# Patient Record
Sex: Female | Born: 1995
Health system: Southern US, Community
[De-identification: ages and names within clinical notes are randomized; demographics above are authoritative.]

## PROBLEM LIST (undated history)

## (undated) DIAGNOSIS — E559 Vitamin D deficiency, unspecified: Secondary | ICD-10-CM

## (undated) DIAGNOSIS — R42 Dizziness and giddiness: Secondary | ICD-10-CM

## (undated) DIAGNOSIS — R002 Palpitations: Secondary | ICD-10-CM

## (undated) DIAGNOSIS — G44209 Tension-type headache, unspecified, not intractable: Secondary | ICD-10-CM

## (undated) DIAGNOSIS — G43009 Migraine without aura, not intractable, without status migrainosus: Secondary | ICD-10-CM

## (undated) DIAGNOSIS — F419 Anxiety disorder, unspecified: Secondary | ICD-10-CM

## (undated) HISTORY — DX: Dizziness and giddiness: R42

## (undated) HISTORY — PX: NO PAST SURGERIES: SHX2092

## (undated) HISTORY — DX: Vitamin D deficiency, unspecified: E55.9

## (undated) HISTORY — DX: Anxiety disorder, unspecified: F41.9

## (undated) HISTORY — DX: Tension-type headache, unspecified, not intractable: G44.209

## (undated) HISTORY — DX: Migraine without aura, not intractable, without status migrainosus: G43.009

## (undated) HISTORY — DX: Palpitations: R00.2

---
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
## 2015-02-10 ENCOUNTER — Ambulatory Visit: Payer: Medicaid Other | Admitting: Neurology

## 2015-04-09 ENCOUNTER — Encounter: Payer: Self-pay | Admitting: Neurology

## 2018-03-16 ENCOUNTER — Telehealth: Payer: Self-pay | Admitting: *Deleted

## 2018-03-16 NOTE — Telephone Encounter (Signed)
REFERRAL SENT TO SCHEDULING, NOTES ON FILE FROM [facility] [place] [place] [phone].

## 2018-03-27 ENCOUNTER — Telehealth: Payer: Self-pay

## 2018-03-27 NOTE — Telephone Encounter (Signed)
NOTES FAXED TO [facility] °

## 2018-03-27 NOTE — Telephone Encounter (Signed)
NOTES FAXED TO [facility] °

## 2018-03-28 DIAGNOSIS — R42 Dizziness and giddiness: Secondary | ICD-10-CM

## 2018-03-28 DIAGNOSIS — R002 Palpitations: Secondary | ICD-10-CM

## 2018-03-28 HISTORY — DX: Palpitations: R00.2

## 2018-03-28 HISTORY — DX: Dizziness and giddiness: R42

## 2018-03-30 ENCOUNTER — Encounter: Payer: Self-pay | Admitting: Cardiology

## 2018-03-30 ENCOUNTER — Ambulatory Visit (INDEPENDENT_AMBULATORY_CARE_PROVIDER_SITE_OTHER): Payer: BLUE CROSS/BLUE SHIELD | Admitting: Cardiology

## 2018-03-30 VITALS — BP 104/70 | HR 62 | Ht 66.0 in | Wt 188.0 lb

## 2018-03-30 DIAGNOSIS — R071 Chest pain on breathing: Secondary | ICD-10-CM

## 2018-03-30 DIAGNOSIS — R0789 Other chest pain: Secondary | ICD-10-CM

## 2018-03-30 DIAGNOSIS — R9431 Abnormal electrocardiogram [ECG] [EKG]: Secondary | ICD-10-CM

## 2018-03-30 MED ORDER — MELOXICAM 15 MG PO TABS: 15.0000 mg | ORAL_TABLET | Freq: Every day | ORAL | 3 refills | Status: DC

## 2018-03-30 NOTE — Patient Instructions (Addendum)
Medication Instructions:  Your physician has recommended you make the following change in your medication:   START meloxicam (mobic) 15 mg daily   Labwork: None  Testing/Procedures: You had an EKG today.   Your physician has requested that you have an echocardiogram. Echocardiography is a painless test that uses sound waves to create images of your heart. It provides your doctor with information about the size and shape of your heart and how well your heart's chambers and valves are working. This procedure takes approximately one hour. There are no restrictions for this procedure.  Follow-Up: Your physician recommends that you schedule a follow-up appointment in: 6 weeks.   If you need a refill on your cardiac medications before your next appointment, please call your pharmacy.   Thank you for choosing [facility]! Mady GemmaCatherine Lockhart, RN [phone]      Costochondritis Costochondritis is swelling and irritation (inflammation) of the tissue (cartilage) that connects your ribs to your breastbone (sternum). This causes pain in the front of your chest. Usually, the pain:  Starts gradually.  Is in more than one rib.  This condition usually goes away on its own over time. Follow these instructions at home:  Do not do anything that makes your pain worse.  If directed, put ice on the painful area: ? Put ice in a plastic bag. ? Place a towel between your skin and the bag. ? Leave the ice on for 20 minutes, 2-3 times a day.  If directed, put heat on the affected area as often as told by your doctor. Use the heat source that your doctor tells you to use, such as a moist heat pack or a heating pad. ? Place a towel between your skin and the heat source. ? Leave the heat on for 20-30 minutes. ? Take off the heat if your skin turns bright red. This is very important if you cannot feel pain, heat, or cold. You may have a greater risk of getting burned.  Take over-the-counter and  prescription medicines only as told by your doctor.  Return to your normal activities as told by your doctor. Ask your doctor what activities are safe for you.  Keep all follow-up visits as told by your doctor. This is important. Contact a doctor if:  You have chills or a fever.  Your pain does not go away or it gets worse.  You have a cough that does not go away. Get help right away if:  You are short of breath. This information is not intended to replace advice given to you by your health care provider. Make sure you discuss any questions you have with your health care provider. Document Released: 12/14/2007 Document Revised: 01/15/2016 Document Reviewed: 10/21/2015 Elsevier Interactive Patient Education  2018 [employer]   Echocardiogram An echocardiogram, or echocardiography, uses sound waves (ultrasound) to produce an image of your heart. The echocardiogram is simple, painless, obtained within a short period of time, and offers valuable information to your health care provider. The images from an echocardiogram can provide information such as:  Evidence of coronary artery disease (CAD).  Heart size.  Heart muscle function.  Heart valve function.  Aneurysm detection.  Evidence of a past heart attack.  Fluid buildup around the heart.  Heart muscle thickening.  Assess heart valve function.  Tell a health care provider about:  Any allergies you have.  All medicines you are taking, including vitamins, herbs, eye drops, creams, and over-the-counter medicines.  Any problems you or family  members have had with anesthetic medicines.  Any blood disorders you have.  Any surgeries you have had.  Any medical conditions you have.  Whether you are pregnant or may be pregnant. What happens before the procedure? No special preparation is needed. Eat and drink normally. What happens during the procedure?  In order to produce an image of your heart, gel will be  applied to your chest and a wand-like tool (transducer) will be moved over your chest. The gel will help transmit the sound waves from the transducer. The sound waves will harmlessly bounce off your heart to allow the heart images to be captured in real-time motion. These images will then be recorded.  You may need an IV to receive a medicine that improves the quality of the pictures. What happens after the procedure? You may return to your normal schedule including diet, activities, and medicines, unless your health care provider tells you otherwise. This information is not intended to replace advice given to you by your health care provider. Make sure you discuss any questions you have with your health care provider. Document Released: 06/24/2000 Document Revised: 02/13/2016 Document Reviewed: 03/04/2013 Elsevier Interactive Patient Education  2017 Elsevier Inc.   Meloxicam tablets What is this medicine? MELOXICAM (mel OX i cam) is a non-steroidal anti-inflammatory drug (NSAID). It is used to reduce swelling and to treat pain. It may be used for osteoarthritis, rheumatoid arthritis, or juvenile rheumatoid arthritis. This medicine may be used for other purposes; ask your health care provider or pharmacist if you have questions. COMMON BRAND NAME(S): Mobic What should I tell my health care provider before I take this medicine? They need to know if you have any of these conditions: -bleeding disorders -cigarette smoker -coronary artery bypass graft (CABG) surgery within the past 2 weeks -drink more than 3 alcohol-containing drinks per day -heart disease -high blood pressure -history of stomach bleeding -kidney disease -liver disease -lung or breathing disease, like asthma -stomach or intestine problems -an unusual or allergic reaction to meloxicam, aspirin, other NSAIDs, other medicines, foods, dyes, or preservatives -pregnant or trying to get pregnant -breast-feeding How should I use  this medicine? Take this medicine by mouth with a full glass of water. Follow the directions on the prescription label. You can take it with or without food. If it upsets your stomach, take it with food. Take your medicine at regular intervals. Do not take it more often than directed. Do not stop taking except on your doctor's advice. A special MedGuide will be given to you by the pharmacist with each prescription and refill. Be sure to read this information carefully each time. Talk to your pediatrician regarding the use of this medicine in children. While this drug may be prescribed for selected conditions, precautions do apply. Patients over 76 years old may have a stronger reaction and need a smaller dose. Overdosage: If you think you have taken too much of this medicine contact a poison control center or emergency room at once. NOTE: This medicine is only for you. Do not share this medicine with others. What if I miss a dose? If you miss a dose, take it as soon as you can. If it is almost time for your next dose, take only that dose. Do not take double or extra doses. What may interact with this medicine? Do not take this medicine with any of the following medications: -cidofovir -ketorolac This medicine may also interact with the following medications: -aspirin and aspirin-like medicines -certain medicines  for blood pressure, heart disease, irregular heart beat -certain medicines for depression, anxiety, or psychotic disturbances -certain medicines that treat or prevent blood clots like warfarin, enoxaparin, dalteparin, apixaban, dabigatran, rivaroxaban -cyclosporine -digoxin -diuretics -methotrexate -other NSAIDs, medicines for pain and inflammation, like ibuprofen and naproxen -pemetrexed This list may not describe all possible interactions. Give your health care provider a list of all the medicines, herbs, non-prescription drugs, or dietary supplements you use. Also tell them if you  smoke, drink alcohol, or use illegal drugs. Some items may interact with your medicine. What should I watch for while using this medicine? Tell your doctor or healthcare professional if your symptoms do not start to get better or if they get worse. Do not take other medicines that contain aspirin, ibuprofen, or naproxen with this medicine. Side effects such as stomach upset, nausea, or ulcers may be more likely to occur. Many medicines available without a prescription should not be taken with this medicine. This medicine can cause ulcers and bleeding in the stomach and intestines at any time during treatment. This can happen with no warning and may cause death. There is increased risk with taking this medicine for a long time. Smoking, drinking alcohol, older age, and poor health can also increase risks. Call your doctor right away if you have stomach pain or blood in your vomit or stool. This medicine does not prevent heart attack or stroke. In fact, this medicine may increase the chance of a heart attack or stroke. The chance may increase with longer use of this medicine and in people who have heart disease. If you take aspirin to prevent heart attack or stroke, talk with your doctor or health care professional. What side effects may I notice from receiving this medicine? Side effects that you should report to your doctor or health care professional as soon as possible: -allergic reactions like skin rash, itching or hives, swelling of the face, lips, or tongue -nausea, vomiting -signs and symptoms of a blood clot such as breathing problems; changes in vision; chest pain; severe, sudden headache; pain, swelling, warmth in the leg; trouble speaking; sudden numbness or weakness of the face, arm, or leg -signs and symptoms of bleeding such as bloody or black, tarry stools; red or dark-brown urine; spitting up blood or brown material that looks like coffee grounds; red spots on the skin; unusual bruising or  bleeding from the eye, gums, or nose -signs and symptoms of liver injury like dark yellow or brown urine; general ill feeling or flu-like symptoms; light-colored stools; loss of appetite; nausea; right upper belly pain; unusually weak or tired; yellowing of the eyes or skin -signs and symptoms of stroke like changes in vision; confusion; trouble speaking or understanding; severe headaches; sudden numbness or weakness of the face, arm, or leg; trouble walking; dizziness; loss of balance or coordination Side effects that usually do not require medical attention (report to your doctor or health care professional if they continue or are bothersome): -constipation -diarrhea -gas This list may not describe all possible side effects. Call your doctor for medical advice about side effects. You may report side effects to FDA at 1-800-FDA-1088. Where should I keep my medicine? Keep out of the reach of children. Store at room temperature between 15 and 30 degrees C (59 and 86 degrees F). Throw away any unused medicine after the expiration date. NOTE: This sheet is a summary. It may not cover all possible information. If you have questions about this medicine, talk to your  doctor, pharmacist, or health care provider.  2018 Elsevier/Gold Standard (2015-07-29 19:28:16)

## 2018-03-30 NOTE — Progress Notes (Signed)
Cardiology Office Note:    Date:  03/30/2018   ID:  Alejandra Bush, DOB 1996-02-20, MRN [id number]  PCP:  Toniann FailScholer, Andrea M, MD  Cardiologist:  Norman HerrlichBrian Munley, MD   Referring MD: Assunta FoundSullivan, Emily B, [place]  ASSESSMENT:    1. Costochondral chest pain   2. Abnormal EKG    PLAN:    In order of problems listed above:  1. Clinically she appears to have very typical costochondral chest pain for further reassurance with her dynamic EKG changes I asked her to undergo echocardiogram and I placed her on a nonsteroidal anti-inflammatory drug as untreated this can become a chronic pain syndrome 2. See above EKG changes are likely nonspecific but in setting of chest pain further evaluation echocardiogram  Next appointment 6 weeks   Medication Adjustments/Labs and Tests Ordered: Current medicines are reviewed at length with the patient today.  Concerns regarding medicines are outlined above.  Orders Placed This Encounter  Procedures  . EKG 12-Lead  . ECHOCARDIOGRAM COMPLETE   Meds ordered this encounter  Medications  . meloxicam (MOBIC) 15 MG tablet    Sig: Take 1 tablet (15 mg total) by mouth daily.    Dispense:  30 tablet    Refill:  3     Chief Complaint  Patient presents with  . Palpitations  . Abnormal ECG    History of Present Illness:    Alejandra Bush is a 22 y.o. female who is being seen today for the evaluation of palpitation and an abnormal EKG at the request of Assunta FoundSullivan, Emily B, [place]. An EKG 8 [facility] 03/05/2018 shows sinus rhythm and precordial T wave inversions leads V1 to V4 it does not have other characteristics of hypertrophic cardiomyopathy this may be a pattern of persistent juvenile T waves.  She was referred with palpitation but when you speak to the patient she is quite firm that she is not aware of her heart beating irregularly but what she feels is a sharp discomfort localized to the left costochondral junction that comes and goes.  She has had no chest  wall trauma and has not had any overuse activities.  She has had no palpitations syncope no anginal discomfort or shortness of breath.  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  Past Medical History:  Diagnosis Date  . Dizziness 03/28/2018  . Migraine without aura and without status migrainosus, not intractable 11/18/2014  . Palpitations 03/28/2018  . Tension headache 11/18/2014  . Vitamin D deficiency 11/18/2014    Past Surgical History:  Procedure Laterality Date  . NO PAST SURGERIES      Current Medications: No outpatient medications have been marked as taking for the 03/30/18 encounter (Office Visit) with Baldo DaubMunley, Brian J, MD.     Allergies:   Patient has no known allergies.   Social History   Socioeconomic History  . Marital status: Single    Spouse name: Not on file  . Number of children: Not on file  . Years of education: Not on file  . Highest education level: Not on file  Occupational History  . Not on file  Social Needs  . Financial resource strain: Not on file  . Food insecurity:    Worry: Not on file    Inability: Not on file  . Transportation needs:    Medical: Not on file    Non-medical: Not on file  Tobacco Use  . Smoking status:  Never Smoker  . Smokeless tobacco: Never Used  Substance and Sexual Activity  . Alcohol use: No    Alcohol/week: 0.0 standard drinks  . Drug use: No  . Sexual activity: Never    Birth control/protection: Abstinence  Lifestyle  . Physical activity:    Days per week: Not on file    Minutes per session: Not on file  . Stress: Not on file  Relationships  . Social connections:    Talks on phone: Not on file    Gets together: Not on file    Attends religious service: Not on file    Active member of club or organization: Not on file    Attends meetings of clubs or organizations: Not on file    Relationship status: Not on file  Other Topics Concern  .  Not on file  Social History Narrative  . Not on file     Family History: The patient's family history is negative for Heart attack, Sudden death, Diabetes, and Cancer.  ROS:   Review of Systems  Constitution: Positive for malaise/fatigue.  HENT: Negative.   Eyes: Negative.   Cardiovascular: Positive for chest pain.  Respiratory: Negative.   Endocrine: Negative.   Hematologic/Lymphatic: Negative.   Skin: Negative.   Musculoskeletal: Negative.   Gastrointestinal: Positive for bloating.  Genitourinary: Negative.   Neurological: Positive for dizziness and headaches.  Psychiatric/Behavioral: Negative.   Allergic/Immunologic: Negative.    Please see the history of present illness.     All other systems reviewed and are negative.  EKGs/Labs/Other Studies Reviewed:    The following studies were reviewed today:   EKG:  EKG is  ordered today.  The ekg ordered today demonstrates her EKG today is normal  Recent Labs: No results found for requested labs within last 8760 hours.  Recent Lipid Panel No results found for: CHOL, TRIG, HDL, CHOLHDL, VLDL, LDLCALC, LDLDIRECT  Physical Exam:    VS:  BP 104/70 (BP Location: Left Arm, Patient Position: Sitting, Cuff Size: Normal)   Pulse 62   Ht 5\' 6"  (1.676 m)   Wt 188 lb (85.3 kg)   SpO2 98%   BMI 30.34 kg/m     Wt Readings from Last 3 Encounters:  03/30/18 188 lb (85.3 kg)  11/18/14 149 lb 9.6 oz (67.9 kg) (83 %, Z= 0.95)*  09/17/14 148 lb 3.2 oz (67.2 kg) (82 %, Z= 0.92)*   * Growth percentiles are based on CDC (Girls, 2-20 Years) data.     GEN:  Well nourished, well developed in no acute distress HEENT: Normal NECK: No JVD; No carotid bruits LYMPHATICS: No lymphadenopathy CARDIAC: Tenderness left costochondral junction reproduces her clinical complaint of localized sharp chest pain RRR, no murmurs, rubs, gallops RESPIRATORY:  Clear to auscultation without rales, wheezing or rhonchi  ABDOMEN: Soft, non-tender,  non-distended MUSCULOSKELETAL:  No edema; No deformity  SKIN: Warm and dry NEUROLOGIC:  Alert and oriented x 3 PSYCHIATRIC:  Normal affect     Signed, Norman Herrlich, MD  03/30/2018 1:14 PM    [facility]

## 2018-04-04 ENCOUNTER — Ambulatory Visit (HOSPITAL_BASED_OUTPATIENT_CLINIC_OR_DEPARTMENT_OTHER)
Admission: RE | Admit: 2018-04-04 | Discharge: 2018-04-04 | Disposition: A | Discharge status: Home / Self Care | Payer: BLUE CROSS/BLUE SHIELD | Source: Ambulatory Visit | Attending: Cardiology | Admitting: Cardiology

## 2018-04-04 DIAGNOSIS — R071 Chest pain on breathing: Secondary | ICD-10-CM | POA: Insufficient documentation

## 2018-04-04 DIAGNOSIS — R9431 Abnormal electrocardiogram [ECG] [EKG]: Secondary | ICD-10-CM | POA: Diagnosis not present

## 2018-04-04 NOTE — Progress Notes (Signed)
  Echocardiogram 2D Echocardiogram has been performed.  Alejandra Bush M 04/04/2018, 8:44 AM

## 2018-05-11 ENCOUNTER — Ambulatory Visit: Payer: BLUE CROSS/BLUE SHIELD | Admitting: Cardiology

## 2018-05-11 DIAGNOSIS — R079 Chest pain, unspecified: Secondary | ICD-10-CM | POA: Insufficient documentation

## 2018-05-11 NOTE — Patient Instructions (Signed)
Medication Instructions:  Your physician has recommended you make the following change in your medication:   STOP meloxicam (mobic)  If you need a refill on your cardiac medications before your next appointment, please call your pharmacy.   Lab work: None  If you have labs (blood work) drawn today and your tests are completely normal, you will receive your results only by: Marland Kitchen MyChart Message (if you have MyChart) OR . A paper copy in the mail If you have any lab test that is abnormal or we need to change your treatment, we will call you to review the results.  Testing/Procedures: None  Follow-Up: At [facility], you and your health needs are our priority.  As part of our continuing mission to provide you with exceptional heart care, we have created designated Provider Care Teams.  These Care Teams include your primary Cardiologist (physician) and Advanced Practice Providers (APPs -  Physician Assistants and Nurse Practitioners) who all work together to provide you with the care you need, when you need it. You will need a follow up appointment as needed if symptoms worsen or fail to improve.

## 2018-05-11 NOTE — Progress Notes (Signed)
Cardiology Office Note:    Date:  05/11/2018   ID:  Alejandra Bush, DOB 08-21-1995, MRN [id number]  PCP:  Toniann Fail, MD  Cardiologist:  Norman Herrlich, MD    Referring MD: Toniann Fail, MD    ASSESSMENT:    1. Chest pain in adult    PLAN:    In order of problems listed above:  1. Very atypical she thinks that this is centered in the left breast and I asked her to go to [facility] have a breast exam and any further imaging necessary.   Next appointment: PRn   Medication Adjustments/Labs and Tests Ordered: Current medicines are reviewed at length with the patient today.  Concerns regarding medicines are outlined above.  No orders of the defined types were placed in this encounter.  No orders of the defined types were placed in this encounter.   No chief complaint on file.   History of Present Illness:    Alejandra Bush is a 22 y.o. female with a hx of costochondral chest pain last seen 03/30/18 and treated with a NSAID.  Echo 04/04/18:  Study Conclusions - Left ventricle: The cavity size was normal. Systolic function was   normal. The estimated ejection fraction was in the range of 55%   to 60%. Wall motion was normal; there were no regional wall   motion abnormalities. Left ventricular diastolic function   parameters were normal. Impressions: - Normal echocardiogram. Compliance with diet, lifestyle and medications: Yes  She is reassured that her echocardiogram is normal but is having vague chest discomfort she describes as pins and needle mostly in the area of the left breast.  She has been to do an exam as chaperone in the room and she has tenderness palpating the breast tissue but no discrete abnormality I told her this outside my field of practice I asked her make appointment [facility] for breast exam and further follow-up.  Her symptoms are not typical costochondral she will stop her nonsteroidal anti-inflammatory drug and I do not  think she needs any further cardiovascular evaluation. Past Medical History:  Diagnosis Date  . Dizziness 03/28/2018  . Migraine without aura and without status migrainosus, not intractable 11/18/2014  . Palpitations 03/28/2018  . Tension headache 11/18/2014  . Vitamin D deficiency 11/18/2014    Past Surgical History:  Procedure Laterality Date  . NO PAST SURGERIES      Current Medications: No outpatient medications have been marked as taking for the 05/11/18 encounter (Office Visit) with Baldo Daub, MD.     Allergies:   Patient has no known allergies.   Social History   Socioeconomic History  . Marital status: Single    Spouse name: Not on file  . Number of children: Not on file  . Years of education: Not on file  . Highest education level: Not on file  Occupational History  . Not on file  Social Needs  . Financial resource strain: Not on file  . Food insecurity:    Worry: Not on file    Inability: Not on file  . Transportation needs:    Medical: Not on file    Non-medical: Not on file  Tobacco Use  . Smoking status: Never Smoker  . Smokeless tobacco: Never Used  Substance and Sexual Activity  . Alcohol use: No    Alcohol/week: 0.0 standard drinks  . Drug use: No  . Sexual activity: Never    Birth control/protection: Abstinence  Lifestyle  .  Physical activity:    Days per week: Not on file    Minutes per session: Not on file  . Stress: Not on file  Relationships  . Social connections:    Talks on phone: Not on file    Gets together: Not on file    Attends religious service: Not on file    Active member of club or organization: Not on file    Attends meetings of clubs or organizations: Not on file    Relationship status: Not on file  Other Topics Concern  . Not on file  Social History Narrative  . Not on file     Family History: The patient's family history is negative for Heart attack, Sudden death, Diabetes, and Cancer. ROS:   Please see the  history of present illness.    All other systems reviewed and are negative.  EKGs/Labs/Other Studies Reviewed:    The following studies were reviewed today  Recent Labs: No results found for requested labs within last 8760 hours.  Recent Lipid Panel No results found for: CHOL, TRIG, HDL, CHOLHDL, VLDL, LDLCALC, LDLDIRECT  Physical Exam:    VS:  BP 104/64 (BP Location: Right Arm, Patient Position: Sitting, Cuff Size: Large)   Pulse 82   Ht 5\' 6"  (1.676 m)   Wt 187 lb 6.4 oz (85 kg)   SpO2 98%   BMI 30.25 kg/m     Wt Readings from Last 3 Encounters:  05/11/18 187 lb 6.4 oz (85 kg)  03/30/18 188 lb (85.3 kg)  11/18/14 149 lb 9.6 oz (67.9 kg) (83 %, Z= 0.95)*   * Growth percentiles are based on CDC (Girls, 2-20 Years) data.     GEN:  Well nourished, well developed in no acute distress HEENT: Normal NECK: No JVD; No carotid bruits LYMPHATICS: No lymphadenopathy CARDIAC: RRR, no murmurs, rubs, gallops RESPIRATORY:  Clear to auscultation without rales, wheezing or rhonchi  ABDOMEN: Soft, non-tender, non-distended MUSCULOSKELETAL:  No edema; No deformity  SKIN: Warm and dry NEUROLOGIC:  Alert and oriented x 3 PSYCHIATRIC:  Normal affect    Signed, Norman Herrlich, MD  05/11/2018 4:19 PM    [facility]

## 2019-04-05 ENCOUNTER — Other Ambulatory Visit: Payer: Self-pay

## 2019-04-05 ENCOUNTER — Emergency Department (HOSPITAL_COMMUNITY): Payer: Self-pay

## 2019-04-05 ENCOUNTER — Encounter (HOSPITAL_COMMUNITY): Payer: Self-pay

## 2019-04-05 ENCOUNTER — Emergency Department (HOSPITAL_COMMUNITY)
Admission: EM | Admit: 2019-04-05 | Discharge: 2019-04-06 | Disposition: A | Discharge status: Home / Self Care | Payer: Self-pay | Attending: Emergency Medicine | Admitting: Emergency Medicine

## 2019-04-05 DIAGNOSIS — Z79899 Other long term (current) drug therapy: Secondary | ICD-10-CM | POA: Insufficient documentation

## 2019-04-05 DIAGNOSIS — R079 Chest pain, unspecified: Secondary | ICD-10-CM | POA: Insufficient documentation

## 2019-04-05 LAB — TROPONIN I (HIGH SENSITIVITY)
Troponin I (High Sensitivity): 3 ng/L (ref <18)
Troponin I (High Sensitivity): 2 ng/L (ref <18)

## 2019-04-05 LAB — CBC
HCT: 38.2 % (ref 36.0–46.0)
Hemoglobin: 12.2 g/dL (ref 12.0–15.0)
MCH: 26 pg (ref 26.0–34.0)
MCHC: 31.9 g/dL (ref 30.0–36.0)
MCV: 81.3 fL (ref 80.0–100.0)
Platelets: 257 10*3/uL (ref 150–400)
RBC: 4.7 MIL/uL (ref 3.87–5.11)
RDW: 15.4 % (ref 11.5–15.5)
WBC: 8.8 10*3/uL (ref 4.0–10.5)
nRBC: 0 % (ref 0.0–0.2)

## 2019-04-05 LAB — BASIC METABOLIC PANEL
Anion gap: 8 (ref 5–15)
BUN: 17 mg/dL (ref 6–20)
CO2: 24 mmol/L (ref 22–32)
Calcium: 9.3 mg/dL (ref 8.9–10.3)
Chloride: 108 mmol/L (ref 98–111)
Creatinine, Ser: 1.28 mg/dL — ABNORMAL HIGH (ref 0.44–1.00)
GFR calc Af Amer: >60 mL/min (ref >60)
GFR calc non Af Amer: 59 mL/min — ABNORMAL LOW (ref >60)
Glucose, Bld: 88 mg/dL (ref 70–99)
Potassium: 4 mmol/L (ref 3.5–5.1)
Sodium: 140 mmol/L (ref 135–145)

## 2019-04-05 MED ORDER — SODIUM CHLORIDE 0.9% FLUSH: 3.0000 mL | Freq: Once | INTRAVENOUS | Status: DC

## 2019-04-05 NOTE — ED Triage Notes (Signed)
Pt reports chest pain since earlier today. Also c.o dizziness. Pt a.o, ambulatory

## 2019-04-06 MED ORDER — OMEPRAZOLE 20 MG PO CPDR: 20.0000 mg | DELAYED_RELEASE_CAPSULE | Freq: Every day | ORAL | 0 refills | Status: DC

## 2019-04-06 MED ORDER — LIDOCAINE VISCOUS HCL 2 % MT SOLN
15.0000 mL | Freq: Once | OROMUCOSAL | Status: AC
  Administered 2019-04-06: 15 mL via ORAL
  Filled 2019-04-06: qty 15

## 2019-04-06 MED ORDER — ALUM & MAG HYDROXIDE-SIMETH 200-200-20 MG/5ML PO SUSP
30.0000 mL | Freq: Once | ORAL | Status: AC
  Administered 2019-04-06: 30 mL via ORAL
  Filled 2019-04-06: qty 30

## 2019-04-06 NOTE — ED Provider Notes (Signed)
[facility] Provider Note   CSN: [id number] Arrival date & time: 04/05/19  1919     History   Chief Complaint Chief Complaint  Patient presents with  . Chest Pain    HPI Alejandra Bush is a 23 y.o. female.     23 year old female presents to the emergency department for evaluation of chest pain.  Pain is central to her chest and nonradiating.  It has been intermittent for quite some time.  Denies any known aggravating or alleviating factors of her symptoms.  States that it will resolve spontaneously after a few hours.  Currently denies any chest pain at present.  She feels mildly short of breath and dizzy when experiencing chest pain, but denies any worsening pain with deep breathing.  Pain is nonexertional and not specifically timed with eating.  No fevers, hemoptysis, leg swelling, recent surgeries or hospitalizations, use of birth control, syncope or near syncope, vomiting.  The history is provided by the patient. No language interpreter was used.  Chest Pain   Past Medical History:  Diagnosis Date  . Dizziness 03/28/2018  . Migraine without aura and without status migrainosus, not intractable 11/18/2014  . Palpitations 03/28/2018  . Tension headache 11/18/2014  . Vitamin D deficiency 11/18/2014    Patient Active Problem List   Diagnosis Date Noted  . Chest pain in adult 05/11/2018  . Abnormal EKG 03/30/2018  . Palpitations 03/28/2018  . Dizziness 03/28/2018  . Migraine without aura and without status migrainosus, not intractable 11/18/2014  . Tension headache 11/18/2014  . Vitamin D deficiency 11/18/2014  . Sports physical 09/18/2014    Past Surgical History:  Procedure Laterality Date  . NO PAST SURGERIES       OB History   No obstetric history on file.      Home Medications    Prior to Admission medications   Medication Sig Start Date End Date Taking? Authorizing Provider  omeprazole (PRILOSEC) 20 MG capsule  Take 1 capsule (20 mg total) by mouth daily. 04/06/19   Antony MaduraHumes, Najib Colmenares, PA-C    Family History Family History  Problem Relation Age of Onset  . Heart attack Neg Hx   . Sudden death Neg Hx   . Diabetes Neg Hx   . Cancer Neg Hx     Social History Social History   Tobacco Use  . Smoking status: Never Smoker  . Smokeless tobacco: Never Used  Substance Use Topics  . Alcohol use: No    Alcohol/week: 0.0 standard drinks  . Drug use: No     Allergies   Patient has no known allergies.   Review of Systems Review of Systems  Cardiovascular: Positive for chest pain.  Ten systems reviewed and are negative for acute change, except as noted in the HPI.    Physical Exam Updated Vital Signs BP 129/77   Pulse 79   Temp 99 F (37.2 C) (Oral)   Resp 18   LMP 03/27/2019 (Exact Date)   SpO2 96%   Physical Exam Vitals signs and nursing note reviewed.  Constitutional:      General: She is not in acute distress.    Appearance: She is well-developed. She is not diaphoretic.     Comments: Nontoxic appearing and in NAD  HENT:     Head: Normocephalic and atraumatic.  Eyes:     General: No scleral icterus.    Conjunctiva/sclera: Conjunctivae normal.  Neck:     Musculoskeletal: Normal range of motion.  Cardiovascular:  Rate and Rhythm: Normal rate and regular rhythm.     Pulses: Normal pulses.  Pulmonary:     Effort: Pulmonary effort is normal. No respiratory distress.     Breath sounds: No stridor. No wheezing, rhonchi or rales.     Comments: Lungs CTAB. Respirations even and unlabored. Musculoskeletal: Normal range of motion.     Comments: No BLE edema  Skin:    General: Skin is warm and dry.     Coloration: Skin is not pale.     Findings: No erythema or rash.  Neurological:     General: No focal deficit present.     Mental Status: She is alert and oriented to person, place, and time.     Coordination: Coordination normal.  Psychiatric:        Behavior: Behavior normal.       ED Treatments / Results  Labs (all labs ordered are listed, but only abnormal results are displayed) Labs Reviewed  BASIC METABOLIC PANEL - Abnormal; Notable for the following components:      Result Value   Creatinine, Ser 1.28 (*)    GFR calc non Af Amer 59 (*)    All other components within normal limits  CBC  TROPONIN I (HIGH SENSITIVITY)  TROPONIN I (HIGH SENSITIVITY)    EKG EKG Interpretation  Date/Time:  Friday April 05 2019 19:28:51 EDT Ventricular Rate:  84 PR Interval:  150 QRS Duration: 68 QT Interval:  336 QTC Calculation: 397 R Axis:   75 Text Interpretation:  Normal sinus rhythm Normal ECG No old tracing to compare Confirmed by Marily Memos [id number]) on 04/05/2019 11:30:28 PM   Radiology Dg Chest 2 View  Result Date: 04/05/2019 CLINICAL DATA:  Chest pain for 2 days. EXAM: CHEST - 2 VIEW COMPARISON:  None. FINDINGS: The heart size and mediastinal contours are within normal limits. Both lungs are clear. The visualized skeletal structures are unremarkable. IMPRESSION: Normal chest x-ray. Electronically Signed   By: Rudie Meyer M.D.   On: 04/05/2019 19:56    Procedures Procedures (including critical care time)  Medications Ordered in ED Medications  sodium chloride flush (NS) 0.9 % injection 3 mL (has no administration in time range)  alum & mag hydroxide-simeth (MAALOX/MYLANTA) 200-200-20 MG/5ML suspension 30 mL (has no administration in time range)    And  lidocaine (XYLOCAINE) 2 % viscous mouth solution 15 mL (has no administration in time range)     Initial Impression / Assessment and Plan / ED Course  I have reviewed the triage vital signs and the nursing notes.  Pertinent labs & imaging results that were available during my care of the patient were reviewed by me and considered in my medical decision making (see chart for details).        Patient presents to the emergency department for evaluation of chest pain.  Low suspicion for  cardiac etiology given reassuring workup today.  EKG is nonischemic and troponin negative x 2.  Chest x-ray without evidence of mediastinal widening to suggest dissection.  No pneumothorax, pneumonia, pleural effusion.  Pulmonary embolus further considered; however, patient without tachycardia, tachypnea, dyspnea, hypoxia.  Patient is PERC negative.  Question exacerbation of esophageal reflux given patient's history of GERD.  Low suspicion for emergent etiology, especially in light of symptom chronicity.  Will start on Prilosec and have her follow-up with a primary care doctor.  Return precautions discussed and provided. Patient discharged in stable condition with no unaddressed concerns.   Final Clinical Impressions(s) /  ED Diagnoses   Final diagnoses:  Nonspecific chest pain    ED Discharge Orders         Ordered    omeprazole (PRILOSEC) 20 MG capsule  Daily     04/06/19 0021           Antonietta Breach, PA-C 04/06/19 0026    Mesner, Corene Cornea, MD 04/06/19 [phone]

## 2019-04-06 NOTE — Discharge Instructions (Signed)
Your work-up in the emergency department today was reassuring and did not reveal a concerning cause of your chest pain.  You were given a prescription for Prilosec to take daily as your chest pain may be due to persistent reflux.  You may also try Tylenol or ibuprofen for management of any persistent pain.  Follow-up with a primary care doctor for repeat assessment if symptoms persist.

## 2019-10-09 ENCOUNTER — Other Ambulatory Visit: Payer: Self-pay

## 2019-10-09 ENCOUNTER — Emergency Department (HOSPITAL_COMMUNITY)
Admission: EM | Admit: 2019-10-09 | Discharge: 2019-10-09 | Disposition: A | Discharge status: Home / Self Care | Payer: BC Managed Care – PPO | Attending: Emergency Medicine | Admitting: Emergency Medicine

## 2019-10-09 ENCOUNTER — Emergency Department (HOSPITAL_COMMUNITY): Payer: BC Managed Care – PPO

## 2019-10-09 ENCOUNTER — Encounter (HOSPITAL_COMMUNITY): Payer: Self-pay | Admitting: Emergency Medicine

## 2019-10-09 DIAGNOSIS — Z79899 Other long term (current) drug therapy: Secondary | ICD-10-CM | POA: Insufficient documentation

## 2019-10-09 DIAGNOSIS — R0789 Other chest pain: Secondary | ICD-10-CM | POA: Diagnosis not present

## 2019-10-09 LAB — BASIC METABOLIC PANEL
Anion gap: 9 (ref 5–15)
BUN: 7 mg/dL (ref 6–20)
CO2: 26 mmol/L (ref 22–32)
Calcium: 9 mg/dL (ref 8.9–10.3)
Chloride: 105 mmol/L (ref 98–111)
Creatinine, Ser: 0.79 mg/dL (ref 0.44–1.00)
GFR calc Af Amer: >60 mL/min (ref >60)
GFR calc non Af Amer: >60 mL/min (ref >60)
Glucose, Bld: 83 mg/dL (ref 70–99)
Potassium: 3.4 mmol/L — ABNORMAL LOW (ref 3.5–5.1)
Sodium: 140 mmol/L (ref 135–145)

## 2019-10-09 LAB — CBC
HCT: 37.2 % (ref 36.0–46.0)
Hemoglobin: 11.6 g/dL — ABNORMAL LOW (ref 12.0–15.0)
MCH: 25.3 pg — ABNORMAL LOW (ref 26.0–34.0)
MCHC: 31.2 g/dL (ref 30.0–36.0)
MCV: 81.2 fL (ref 80.0–100.0)
Platelets: 237 10*3/uL (ref 150–400)
RBC: 4.58 MIL/uL (ref 3.87–5.11)
RDW: 15.6 % — ABNORMAL HIGH (ref 11.5–15.5)
WBC: 7.3 10*3/uL (ref 4.0–10.5)
nRBC: 0 % (ref 0.0–0.2)

## 2019-10-09 LAB — TROPONIN I (HIGH SENSITIVITY)
Troponin I (High Sensitivity): 3 ng/L (ref <18)
Troponin I (High Sensitivity): 4 ng/L (ref <18)

## 2019-10-09 LAB — PROTIME-INR
INR: 1.1 (ref 0.8–1.2)
Prothrombin Time: 13.6 seconds (ref 11.4–15.2)

## 2019-10-09 LAB — I-STAT BETA HCG BLOOD, ED (MC, WL, AP ONLY): I-stat hCG, quantitative: <5 m[IU]/mL (ref <5)

## 2019-10-09 MED ORDER — SODIUM CHLORIDE 0.9% FLUSH: 3.0000 mL | Freq: Once | INTRAVENOUS | Status: DC

## 2019-10-09 MED ORDER — IBUPROFEN 800 MG PO TABS: 800.0000 mg | ORAL_TABLET | Freq: Three times a day (TID) | ORAL | 0 refills | Status: DC | PRN

## 2019-10-09 NOTE — ED Triage Notes (Signed)
Patient arrived with EMS from home reports central chest pain radiating to upper back with nausea this evening , denies SOB , no emesis or diaphoresis .

## 2019-10-09 NOTE — ED Provider Notes (Signed)
[facility] EMERGENCY DEPARTMENT Provider Note   CSN: [id number] Arrival date & time: 10/09/19  0033     History Chief Complaint  Patient presents with  . Chest Pain    Alejandra Bush is a 24 y.o. female.  HPI Patient presents to the emergency department with sharp chest pain that started yesterday.  The patient states that she has had chest pain off and on for over several years.  The patient states that last night it was sharp and worse with certain movements and deep breathing.  The patient states that she saw cardiologist at the end of last year and they did testing which was all normal.  The patient states that nothing seems to make the condition better that she tried.  Patient states she did not take any medications prior to arrival for her symptoms.  The patient stated that the pain did seem to go to her back bilaterally in the upper part.  The patient denies  shortness of breath, headache,blurred vision, neck pain, fever, cough, weakness, numbness, dizziness, anorexia, edema, abdominal pain, nausea, vomiting, diarrhea, rash, back pain, dysuria, hematemesis, bloody stool, near syncope, or syncope.    Past Medical History:  Diagnosis Date  . Dizziness 03/28/2018  . Migraine without aura and without status migrainosus, not intractable 11/18/2014  . Palpitations 03/28/2018  . Tension headache 11/18/2014  . Vitamin D deficiency 11/18/2014    Patient Active Problem List   Diagnosis Date Noted  . Chest pain in adult 05/11/2018  . Abnormal EKG 03/30/2018  . Palpitations 03/28/2018  . Dizziness 03/28/2018  . Migraine without aura and without status migrainosus, not intractable 11/18/2014  . Tension headache 11/18/2014  . Vitamin D deficiency 11/18/2014  . Sports physical 09/18/2014    Past Surgical History:  Procedure Laterality Date  . NO PAST SURGERIES       OB History   No obstetric history on file.     Family History  Problem Relation Age of  Onset  . Heart attack Neg Hx   . Sudden death Neg Hx   . Diabetes Neg Hx   . Cancer Neg Hx     Social History   Tobacco Use  . Smoking status: Never Smoker  . Smokeless tobacco: Never Used  Substance Use Topics  . Alcohol use: No    Alcohol/week: 0.0 standard drinks  . Drug use: No    Home Medications Prior to Admission medications   Medication Sig Start Date End Date Taking? Authorizing Provider  omeprazole (PRILOSEC) 20 MG capsule Take 1 capsule (20 mg total) by mouth daily. 04/06/19   Antony Madura, PA-C    Allergies    Patient has no known allergies.  Review of Systems   Review of Systems All other systems negative except as documented in the HPI. All pertinent positives and negatives as reviewed in the HPI. Physical Exam Updated Vital Signs BP 114/66   Pulse 65   Temp 98.2 F (36.8 C) (Oral)   Resp (!) 24   Ht 5\' 6"  (1.676 m)   Wt 100 kg   LMP 09/12/2019   SpO2 100%   BMI 35.58 kg/m   Physical Exam Vitals and nursing note reviewed.  Constitutional:      General: She is not in acute distress.    Appearance: She is well-developed.  HENT:     Head: Normocephalic and atraumatic.  Eyes:     Pupils: Pupils are equal, round, and reactive to light.  Cardiovascular:  Rate and Rhythm: Normal rate and regular rhythm.     Heart sounds: Normal heart sounds. No murmur. No friction rub. No gallop.   Pulmonary:     Effort: Pulmonary effort is normal. No respiratory distress.     Breath sounds: Normal breath sounds. No wheezing.  Chest:     Chest wall: No tenderness.  Abdominal:     General: Bowel sounds are normal. There is no distension.     Palpations: Abdomen is soft.     Tenderness: There is no abdominal tenderness.  Musculoskeletal:     Cervical back: Normal range of motion and neck supple.  Skin:    General: Skin is warm and dry.     Capillary Refill: Capillary refill takes less than 2 seconds.     Findings: No erythema or rash.  Neurological:      Mental Status: She is alert and oriented to person, place, and time.     Motor: No abnormal muscle tone.     Coordination: Coordination normal.  Psychiatric:        Behavior: Behavior normal.     ED Results / Procedures / Treatments   Labs (all labs ordered are listed, but only abnormal results are displayed) Labs Reviewed  BASIC METABOLIC PANEL - Abnormal; Notable for the following components:      Result Value   Potassium 3.4 (*)    All other components within normal limits  CBC - Abnormal; Notable for the following components:   Hemoglobin 11.6 (*)    MCH 25.3 (*)    RDW 15.6 (*)    All other components within normal limits  PROTIME-INR  I-STAT BETA HCG BLOOD, ED ([facility], [facility], [facility] ONLY)  TROPONIN I (HIGH SENSITIVITY)  TROPONIN I (HIGH SENSITIVITY)    EKG EKG Interpretation  Date/Time:  Wednesday October 09 2019 01:05:39 EDT Ventricular Rate:  66 PR Interval:  162 QRS Duration: 82 QT Interval:  400 QTC Calculation: 419 R Axis:   53 Text Interpretation: Normal sinus rhythm with sinus arrhythmia Normal ECG Confirmed by Virgina Norfolk [phone]) on 10/09/2019 6:59:29 AM   Radiology DG Chest 2 View  Result Date: 10/09/2019 CLINICAL DATA:  Chest pain EXAM: CHEST - 2 VIEW COMPARISON:  04/05/2019 FINDINGS: The heart size and mediastinal contours are within normal limits. Both lungs are clear. The visualized skeletal structures are unremarkable. IMPRESSION: No active cardiopulmonary disease. Electronically Signed   By: Deatra Robinson M.D.   On: 10/09/2019 01:41    Procedures Procedures (including critical care time)  Medications Ordered in ED Medications  sodium chloride flush (NS) 0.9 % injection 3 mL (3 mLs Intravenous Not Given 10/09/19 0093)    ED Course  I have reviewed the triage vital signs and the nursing notes.  Pertinent labs & imaging results that were available during my care of the patient were reviewed by me and considered in my medical decision making (see chart  for details).    MDM Rules/Calculators/A&P                      Patient is PERC negative.  The patient states that she does not use any contraceptive medications.  The patient has 2 sets of negative high-sensitivity troponins.  I will have the patient recheck with her cardiologist that she is seen in the past.  I advised the patient to return here for any worsening in her condition.  This seems to be atypical for cardiac chest pain based on the  fact that it is been a longstanding issue that is similar in nature.  Is also worsening with certain movements and deep breathing.  Patient does not have any significant risk factors for pulmonary embolus as well. Final Clinical Impression(s) / ED Diagnoses Final diagnoses:  None    Rx / DC Orders ED Discharge Orders    None       Dalia Heading, PA-C 10/09/19 0809    Lennice Sites, DO 10/09/19 [phone]

## 2019-10-09 NOTE — ED Notes (Signed)
Pt verbalized understanding of discharge instructions. Follow up care reviewed, pt had no further questions. 

## 2019-10-09 NOTE — Discharge Instructions (Signed)
Your testing here today did not show any abnormalities at this time.  Your heart enzyme tests were negative.  Your EKG did not show any signs of heart damage or heart attack.  I would like for you to follow-up with the cardiologist that you have seen in the past.

## 2020-07-11 ENCOUNTER — Ambulatory Visit
Admission: EM | Admit: 2020-07-11 | Discharge: 2020-07-11 | Disposition: A | Discharge status: Home / Self Care | Payer: BC Managed Care – PPO | Attending: Emergency Medicine | Admitting: Emergency Medicine

## 2020-07-11 ENCOUNTER — Other Ambulatory Visit: Payer: Self-pay

## 2020-07-11 DIAGNOSIS — R1084 Generalized abdominal pain: Secondary | ICD-10-CM

## 2020-07-11 LAB — POCT URINE PREGNANCY: Preg Test, Ur: NEGATIVE

## 2020-07-11 MED ORDER — MELOXICAM 7.5 MG PO TABS: 7.5000 mg | ORAL_TABLET | Freq: Every day | ORAL | 0 refills | Status: DC

## 2020-07-11 NOTE — ED Provider Notes (Signed)
[name]    CSN: [id number] Arrival date & time: 07/11/20  1252      History   Chief Complaint Chief Complaint  Patient presents with  . Abdominal Pain    HPI Alejandra Bush is a 25 y.o. female  Resenting for menstrual cramps. Has history thereof: Has not taken ibuprofen for this. Denies heavy bleeding, discharge. Requesting pregnancy test. LMP 12/21. Also requesting Covid testing due to general malaise.  Past Medical History:  Diagnosis Date  . Dizziness 03/28/2018  . Migraine without aura and without status migrainosus, not intractable 11/18/2014  . Palpitations 03/28/2018  . Tension headache 11/18/2014  . Vitamin D deficiency 11/18/2014    Patient Active Problem List   Diagnosis Date Noted  . Chest pain in adult 05/11/2018  . Abnormal EKG 03/30/2018  . Palpitations 03/28/2018  . Dizziness 03/28/2018  . Migraine without aura and without status migrainosus, not intractable 11/18/2014  . Tension headache 11/18/2014  . Vitamin D deficiency 11/18/2014  . Sports physical 09/18/2014    Past Surgical History:  Procedure Laterality Date  . NO PAST SURGERIES      OB History   No obstetric history on file.      Home Medications    Prior to Admission medications   Medication Sig Start Date End Date Taking? Authorizing Provider  meloxicam (MOBIC) 7.5 MG tablet Take 1 tablet (7.5 mg total) by mouth daily. 07/11/20  Yes Hall-Potvin, [place], PA-C  omeprazole (PRILOSEC) 20 MG capsule Take 1 capsule (20 mg total) by mouth daily. 04/06/19   Antony Madura, PA-C    Family History Family History  Problem Relation Age of Onset  . Healthy Mother   . Healthy Father   . Heart attack Neg Hx   . Sudden death Neg Hx   . Diabetes Neg Hx   . Cancer Neg Hx     Social History Social History   Tobacco Use  . Smoking status: Never Smoker  . Smokeless tobacco: Never Used  Vaping Use  . Vaping Use: Never used  Substance Use Topics  . Alcohol use: No  .  Drug use: No     Allergies   Patient has no known allergies.   Review of Systems Review of Systems  Constitutional: Negative for fatigue and fever.  HENT: Negative for ear pain, sinus pain, sore throat and voice change.   Eyes: Negative for pain, redness and visual disturbance.  Respiratory: Negative for cough and shortness of breath.   Cardiovascular: Negative for chest pain and palpitations.  Gastrointestinal: Positive for abdominal pain. Negative for diarrhea and vomiting.  Musculoskeletal: Negative for arthralgias and myalgias.  Skin: Negative for rash and wound.  Neurological: Negative for syncope and headaches.     Physical Exam Triage Vital Signs ED Triage Vitals  Enc Vitals Group     BP 07/11/20 1404 110/75     Pulse Rate 07/11/20 1404 68     Resp 07/11/20 1404 18     Temp 07/11/20 1404 98.1 F (36.7 C)     Temp Source 07/11/20 1404 Oral     SpO2 07/11/20 1404 100 %     Weight --      Height --      Head Circumference --      Peak Flow --      Pain Score 07/11/20 1316 3     Pain Loc --      Pain Edu? --      Excl. in GC? --  No data found.  Updated Vital Signs BP 110/75 (BP Location: Left Arm)   Pulse 68   Temp 98.1 F (36.7 C) (Oral)   Resp 18   LMP 06/30/2020   SpO2 100%   Visual Acuity Right Eye Distance:   Left Eye Distance:   Bilateral Distance:    Right Eye Near:   Left Eye Near:    Bilateral Near:     Physical Exam Constitutional:      General: She is not in acute distress. HENT:     Head: Normocephalic and atraumatic.  Eyes:     General: No scleral icterus.    Pupils: Pupils are equal, round, and reactive to light.  Cardiovascular:     Rate and Rhythm: Normal rate.  Pulmonary:     Effort: Pulmonary effort is normal.  Abdominal:     General: Abdomen is flat. Bowel sounds are normal.     Palpations: Abdomen is soft. There is no hepatomegaly or splenomegaly.  Skin:    Coloration: Skin is not jaundiced or pale.   Neurological:     Mental Status: She is alert and oriented to person, place, and time.      UC Treatments / Results  Labs (all labs ordered are listed, but only abnormal results are displayed) Labs Reviewed  NOVEL CORONAVIRUS, NAA  POCT URINE PREGNANCY    EKG   Radiology No results found.  Procedures Procedures (including critical care time)  Medications Ordered in UC Medications - No data to display  Initial Impression / Assessment and Plan / UC Course  I have reviewed the triage vital signs and the nursing notes.  Pertinent labs & imaging results that were available during my care of the patient were reviewed by me and considered in my medical decision making (see chart for details).     Patient afebrile, nontoxic, with SpO2 100%.  Covid PCR pending.  Patient to quarantine until results are back.  We will treat supportively as outlined below.  Return precautions discussed, patient verbalized understanding and is agreeable to plan. Final Clinical Impressions(s) / UC Diagnoses   Final diagnoses:  Generalized abdominal pain     Discharge Instructions     Your COVID test is pending - it is important to quarantine / isolate at home until your results are back. If you test positive and would like further evaluation for persistent or worsening symptoms, you may schedule an E-visit or virtual (video) visit throughout the [facility] app or website.  PLEASE NOTE: If you develop severe chest pain or shortness of breath please go to the ER or call 9-1-1 for further evaluation --> DO NOT schedule electronic or virtual visits for this. Please call our office for further guidance / recommendations as needed.  For information about the Covid vaccine, please visit [email or web address]    ED Prescriptions    Medication Sig Dispense Auth. Provider   meloxicam (MOBIC) 7.5 MG tablet Take 1 tablet (7.5 mg total) by mouth daily. 14 tablet Hall-Potvin, [place], PA-C      PDMP not reviewed this encounter.   Hall-Potvin, [place], [place] 07/11/20 1454

## 2020-07-11 NOTE — Discharge Instructions (Signed)
Your COVID test is pending - it is important to quarantine / isolate at home until your results are back. °If you test positive and would like further evaluation for persistent or worsening symptoms, you may schedule an E-visit or virtual (video) visit throughout the [facility] MyChart app or website. ° °PLEASE NOTE: If you develop severe chest pain or shortness of breath please go to the ER or call 9-1-1 for further evaluation --> DO NOT schedule electronic or virtual visits for this. °Please call our office for further guidance / recommendations as needed. ° °For information about the Covid vaccine, please visit [email or web address] °

## 2020-07-11 NOTE — ED Triage Notes (Signed)
Pt is here with abdominal pain that started yesterday, pt has a hx of abdominal issues. Pt has taken Advil to relieve discomfort.

## 2020-07-13 LAB — NOVEL CORONAVIRUS, NAA: SARS-CoV-2, NAA: NOT DETECTED

## 2020-07-13 LAB — SARS-COV-2, NAA 2 DAY TAT

## 2020-09-20 IMAGING — DX DG CHEST 2V
2 series · 2 of 2 positions shown · non-contrast
Comparison: 04/05/2019

CLINICAL DATA: Chest pain

EXAM:
CHEST - 2 VIEW

[chest pa]
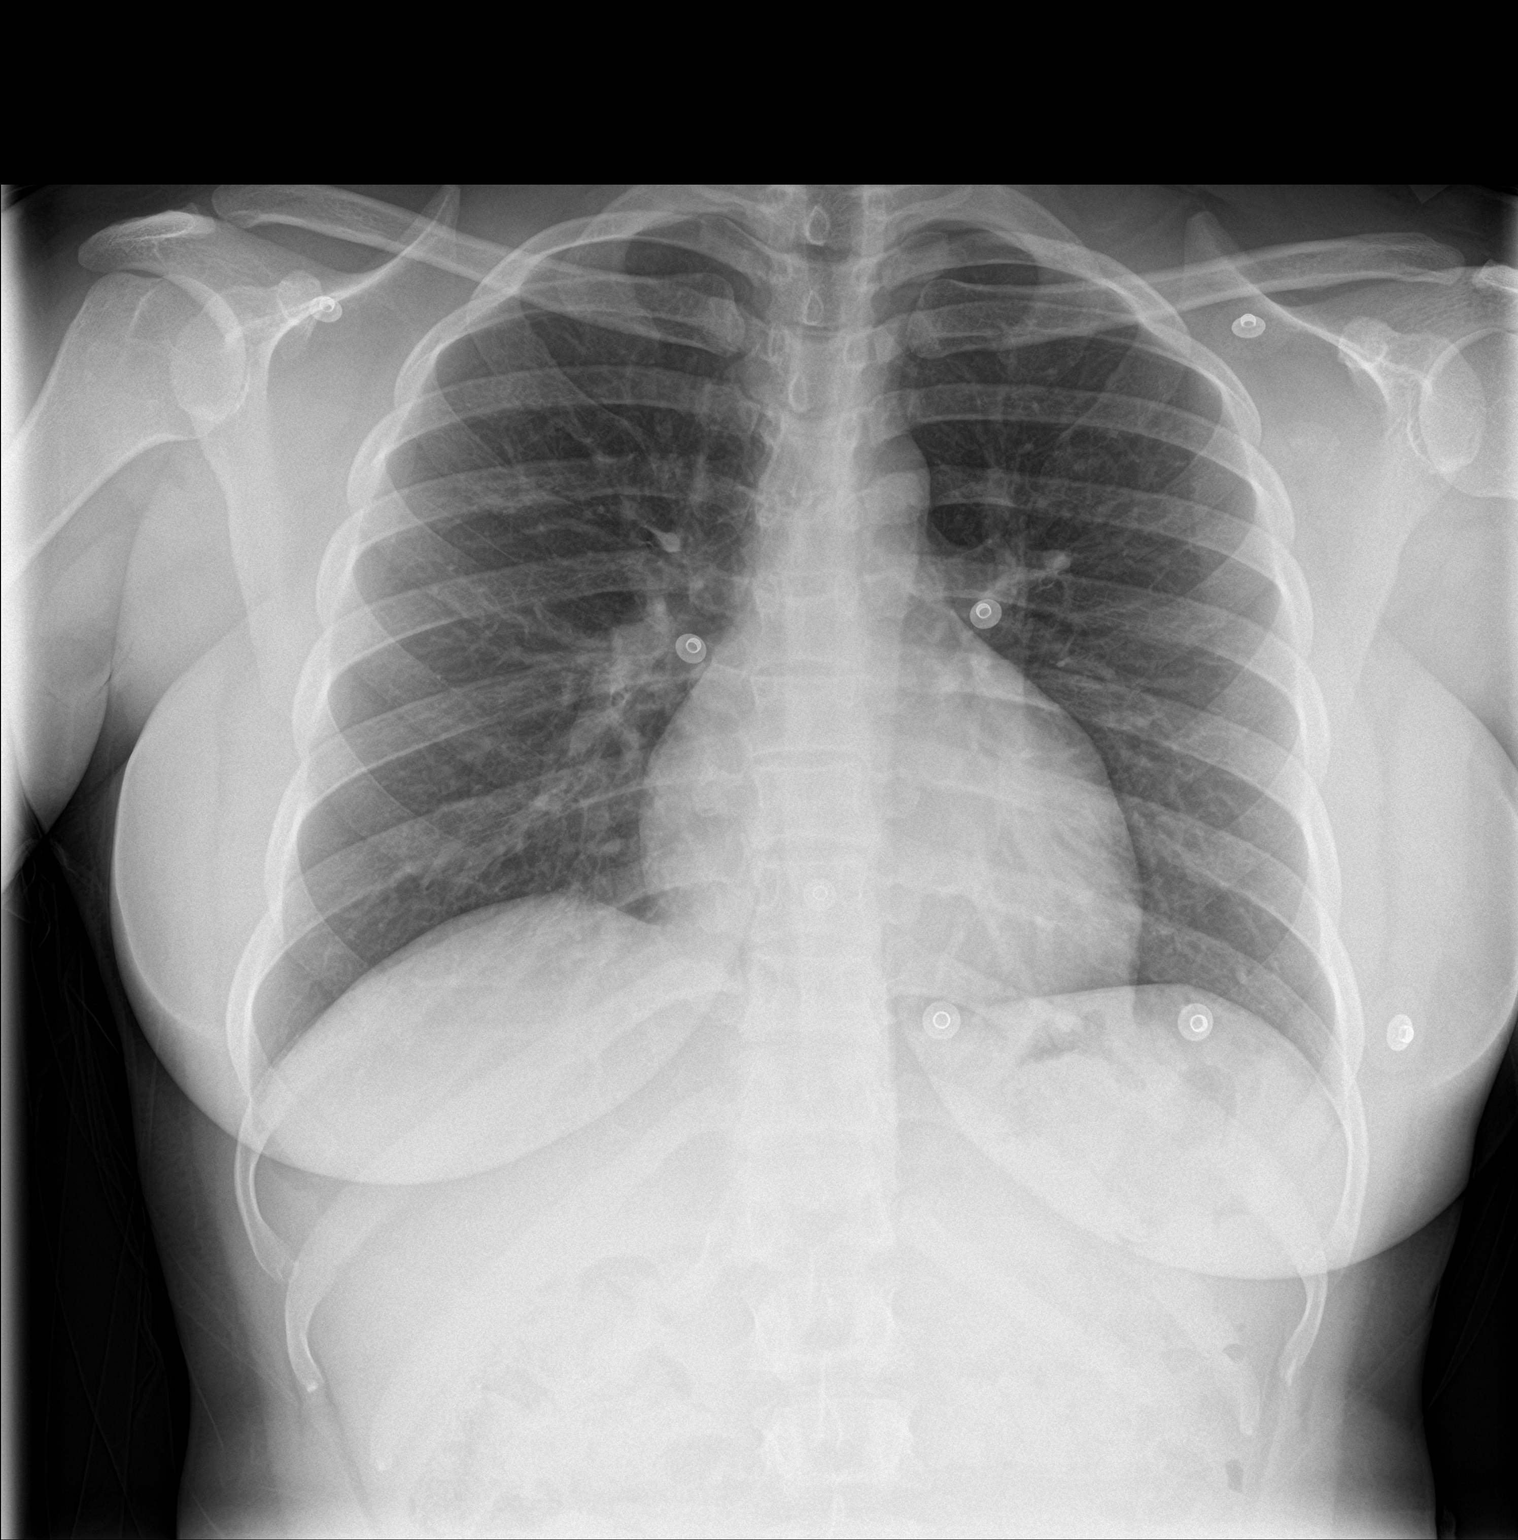

[chest lat]
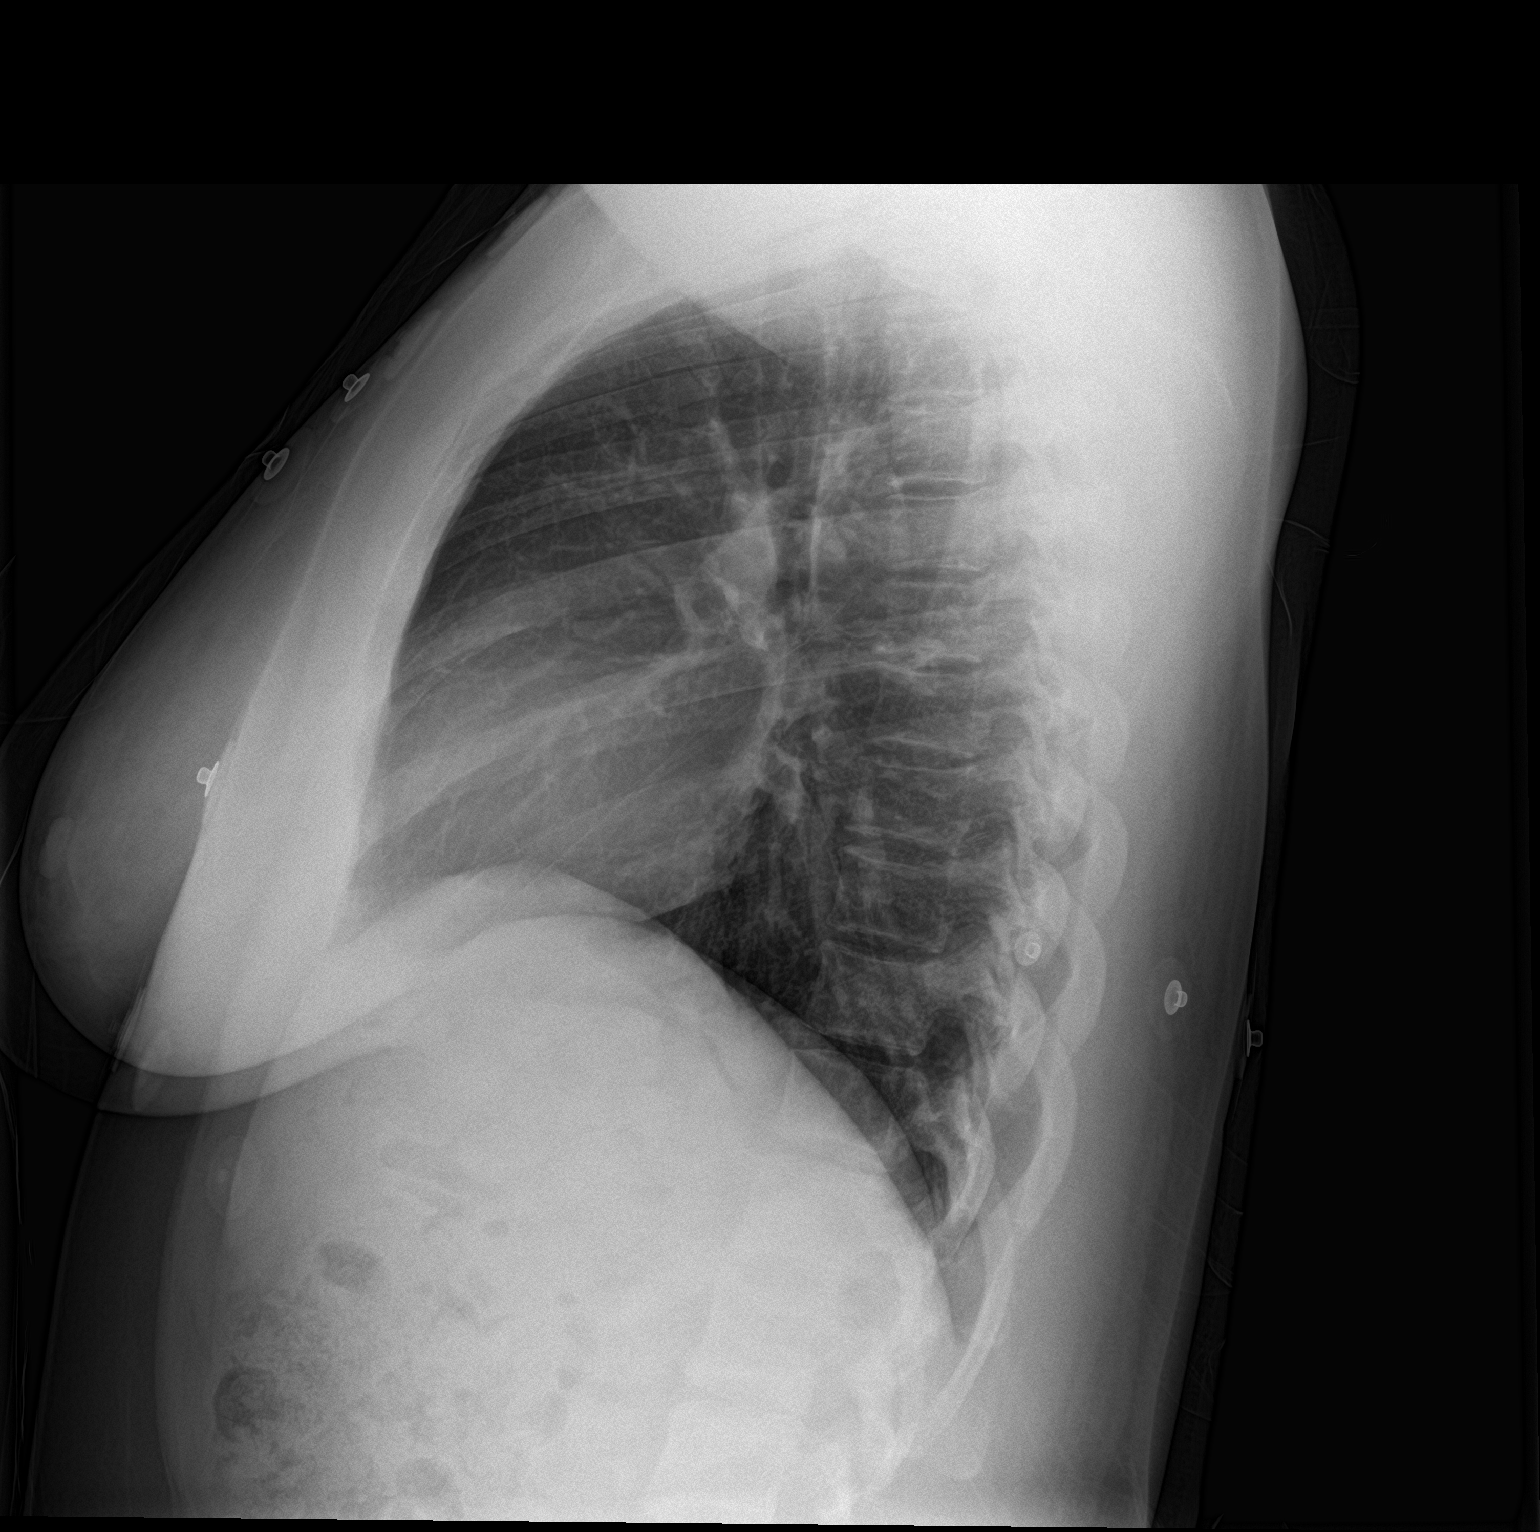

[2 of 2 positions shown; findings below may reference images not displayed]

FINDINGS: The heart size and mediastinal contours are within normal limits.
Both lungs are clear. The visualized skeletal structures are
unremarkable.
IMPRESSION: No active cardiopulmonary disease.

## 2020-10-13 ENCOUNTER — Other Ambulatory Visit: Payer: Self-pay | Admitting: Family Medicine

## 2020-10-13 DIAGNOSIS — N644 Mastodynia: Secondary | ICD-10-CM

## 2020-11-04 ENCOUNTER — Other Ambulatory Visit: Payer: BC Managed Care – PPO

## 2020-11-25 ENCOUNTER — Inpatient Hospital Stay: Admission: RE | Admit: 2020-11-25 | Payer: BC Managed Care – PPO | Source: Ambulatory Visit

## 2021-02-22 ENCOUNTER — Telehealth: Payer: BC Managed Care – PPO | Admitting: Nurse Practitioner

## 2021-02-23 NOTE — Progress Notes (Signed)
Based on what you shared with me, I feel your condition warrants further evaluation and I recommend that you be seen for a face to face visit.  Please contact your primary care physician practice to be seen. Many offices offer virtual options to be seen via video if you are not comfortable going in person to a medical facility at this time. ° °NOTE: You will NOT be charged for this eVisit. ° °If you do not have a PCP, [facility] offers a free physician referral service available at [phone]. Our trained staff has the experience, knowledge and resources to put you in touch with a physician who is right for you.  ° ° °If you are having a true medical emergency please call 911. ° ° °Your e-visit answers were reviewed by a board certified advanced clinical practitioner to complete your personal care plan.  Thank you for using e-Visits.  °

## 2021-03-03 ENCOUNTER — Encounter: Payer: BC Managed Care – PPO | Admitting: Obstetrics and Gynecology

## 2021-04-15 ENCOUNTER — Other Ambulatory Visit (HOSPITAL_COMMUNITY)
Admission: RE | Admit: 2021-04-15 | Discharge: 2021-04-15 | Disposition: A | Discharge status: Home / Self Care | Payer: 59 | Source: Ambulatory Visit | Attending: Obstetrics and Gynecology | Admitting: Obstetrics and Gynecology

## 2021-04-15 ENCOUNTER — Encounter: Payer: Self-pay | Admitting: Obstetrics and Gynecology

## 2021-04-15 ENCOUNTER — Other Ambulatory Visit: Payer: Self-pay

## 2021-04-15 ENCOUNTER — Ambulatory Visit (INDEPENDENT_AMBULATORY_CARE_PROVIDER_SITE_OTHER): Payer: 59 | Admitting: Obstetrics and Gynecology

## 2021-04-15 DIAGNOSIS — R102 Pelvic and perineal pain: Secondary | ICD-10-CM

## 2021-04-15 DIAGNOSIS — Z202 Contact with and (suspected) exposure to infections with a predominantly sexual mode of transmission: Secondary | ICD-10-CM | POA: Insufficient documentation

## 2021-04-15 DIAGNOSIS — Z01419 Encounter for gynecological examination (general) (routine) without abnormal findings: Secondary | ICD-10-CM

## 2021-04-15 NOTE — Progress Notes (Signed)
Reports lower abd pain intermittently throughout the day. Denies urinary symptoms. Increased discharge at times.  Had Pap at [facility] 2020, normal.

## 2021-04-15 NOTE — Progress Notes (Signed)
Adrien Abplanalp is a 25 y.o. No obstetric history on file. female here for a routine annual gynecologic exam.  Current complaints: abd/pelvic pain since she was 5. Cycles are monthly, painful first 2 days.  Used OCP's in the past which helped with her pain for 2 months and then did not help, so she stopped taking them. Not sexual active at present.   Denies any bowel or bladder dysfunction Denies H/O STD's  Gynecologic History Patient's last menstrual period was 03/22/2021 (exact date). Contraception: none Last Pap: 2020. Results were: normal Last mammogram: NA.   Obstetric History OB History  No obstetric history on file.    Past Medical History:  Diagnosis Date   Dizziness 03/28/2018   Migraine without aura and without status migrainosus, not intractable 11/18/2014   Palpitations 03/28/2018   Tension headache 11/18/2014   Vitamin D deficiency 11/18/2014    Past Surgical History:  Procedure Laterality Date   NO PAST SURGERIES      Current Outpatient Medications on File Prior to Visit  Medication Sig Dispense Refill   sertraline (ZOLOFT) 50 MG tablet Take 50 mg by mouth 3 (three) times daily.     No current facility-administered medications on file prior to visit.    No Known Allergies  Social History   Socioeconomic History   Marital status: Single    Spouse name: Not on file   Number of children: Not on file   Years of education: Not on file   Highest education level: Not on file  Occupational History   Not on file  Tobacco Use   Smoking status: Never   Smokeless tobacco: Never  Vaping Use   Vaping Use: Never used  Substance and Sexual Activity   Alcohol use: No   Drug use: No   Sexual activity: Yes    Birth control/protection: None  Other Topics Concern   Not on file  Social History Narrative   Not on file   Social Determinants of Health   Financial Resource Strain: Not on file  Food Insecurity: Not on file  Transportation Needs: Not on file   Physical Activity: Not on file  Stress: Not on file  Social Connections: Not on file  Intimate Partner Violence: Not on file    Family History  Problem Relation Age of Onset   Healthy Mother    Healthy Father    Heart attack Neg Hx    Sudden death Neg Hx    Diabetes Neg Hx    Cancer Neg Hx     The following portions of the patient's history were reviewed and updated as appropriate: allergies, current medications, past family history, past medical history, past social history, past surgical history and problem list.  Review of Systems Pertinent items noted in HPI and remainder of comprehensive ROS otherwise negative.   Objective:  BP 140/75   Pulse 73   Ht 5\' 7"  (1.702 m)   Wt 227 lb (103 kg)   LMP 03/22/2021 (Exact Date)   BMI 35.55 kg/m  CONSTITUTIONAL: Well-developed, well-nourished female in no acute distress.  HENT:  Normocephalic, atraumatic, External right and left ear normal. Oropharynx is clear and moist EYES: Conjunctivae and EOM are normal. Pupils are equal, round, and reactive to light. No scleral icterus.  NECK: Normal range of motion, supple, no masses.  Normal thyroid.  SKIN: Skin is warm and dry. No rash noted. Not diaphoretic. No erythema. No pallor. NEUROLGIC: Alert and oriented to person, place, and time. Normal reflexes, muscle  tone coordination. No cranial nerve deficit noted. PSYCHIATRIC: Normal mood and affect. Normal behavior. Normal judgment and thought content. CARDIOVASCULAR: Normal heart rate noted, regular rhythm RESPIRATORY: Clear to auscultation bilaterally. Effort and breath sounds normal, no problems with respiration noted. BREASTS: Symmetric in size. No masses, skin changes, nipple drainage, or lymphadenopathy. ABDOMEN: Soft, normal bowel sounds, no distention noted.  No tenderness, rebound or guarding.  PELVIC: Normal appearing external genitalia; normal appearing vaginal mucosa and cervix.  No abnormal discharge noted.  Pap smear obtained.   Normal uterine size, no other palpable masses, no uterine or adnexal tenderness. MUSCULOSKELETAL: Normal range of motion. No tenderness.  No cyanosis, clubbing, or edema.  2+ distal pulses.   Assessment:  Annual gynecologic examination with pap smear Abd/pelvic pain STD exposure Plan:  Will follow up results of pap smear and manage accordingly. STD testing as per pt request. Will check GYN U/S. F/U in 4 weeks to discuss results and treatment options.  Routine preventative health maintenance measures emphasized. Please refer to After Visit Summary for other counseling recommendations.    Hermina Staggers, MD, FACOG Attending Obstetrician & Gynecologist [facility], [facility]

## 2021-04-15 NOTE — Patient Instructions (Signed)
Health Maintenance, Female Adopting a healthy lifestyle and getting preventive care are important in promoting health and wellness. Ask your health care provider about: The right schedule for you to have regular tests and exams. Things you can do on your own to prevent diseases and keep yourself healthy. What should I know about diet, weight, and exercise? Eat a healthy diet  Eat a diet that includes plenty of vegetables, fruits, low-fat dairy products, and lean protein. Do not eat a lot of foods that are high in solid fats, added sugars, or sodium. Maintain a healthy weight Body mass index (BMI) is used to identify weight problems. It estimates body fat based on height and weight. Your health care provider can help determine your BMI and help you achieve or maintain a healthy weight. Get regular exercise Get regular exercise. This is one of the most important things you can do for your health. Most adults should: Exercise for at least 150 minutes each week. The exercise should increase your heart rate and make you sweat (moderate-intensity exercise). Do strengthening exercises at least twice a week. This is in addition to the moderate-intensity exercise. Spend less time sitting. Even light physical activity can be beneficial. Watch cholesterol and blood lipids Have your blood tested for lipids and cholesterol at 25 years of age, then have this test every 5 years. Have your cholesterol levels checked more often if: Your lipid or cholesterol levels are high. You are older than 25 years of age. You are at high risk for heart disease. What should I know about cancer screening? Depending on your health history and family history, you may need to have cancer screening at various ages. This may include screening for: Breast cancer. Cervical cancer. Colorectal cancer. Skin cancer. Lung cancer. What should I know about heart disease, diabetes, and high blood pressure? Blood pressure and heart  disease High blood pressure causes heart disease and increases the risk of stroke. This is more likely to develop in people who have high blood pressure readings, are of African descent, or are overweight. Have your blood pressure checked: Every 3-5 years if you are 18-39 years of age. Every year if you are 40 years old or older. Diabetes Have regular diabetes screenings. This checks your fasting blood sugar level. Have the screening done: Once every three years after age 40 if you are at a normal weight and have a low risk for diabetes. More often and at a younger age if you are overweight or have a high risk for diabetes. What should I know about preventing infection? Hepatitis B If you have a higher risk for hepatitis B, you should be screened for this virus. Talk with your health care provider to find out if you are at risk for hepatitis B infection. Hepatitis C Testing is recommended for: Everyone born from 1945 through 1965. Anyone with known risk factors for hepatitis C. Sexually transmitted infections (STIs) Get screened for STIs, including gonorrhea and chlamydia, if: You are sexually active and are younger than 24 years of age. You are older than 24 years of age and your health care provider tells you that you are at risk for this type of infection. Your sexual activity has changed since you were last screened, and you are at increased risk for chlamydia or gonorrhea. Ask your health care provider if you are at risk. Ask your health care provider about whether you are at high risk for HIV. Your health care provider may recommend a prescription medicine   to help prevent HIV infection. If you choose to take medicine to prevent HIV, you should first get tested for HIV. You should then be tested every 3 months for as long as you are taking the medicine. Pregnancy If you are about to stop having your period (premenopausal) and you may become pregnant, seek counseling before you get  pregnant. Take 400 to 800 micrograms (mcg) of folic acid every day if you become pregnant. Ask for birth control (contraception) if you want to prevent pregnancy. Osteoporosis and menopause Osteoporosis is a disease in which the bones lose minerals and strength with aging. This can result in bone fractures. If you are 65 years old or older, or if you are at risk for osteoporosis and fractures, ask your health care provider if you should: Be screened for bone loss. Take a calcium or vitamin D supplement to lower your risk of fractures. Be given hormone replacement therapy (HRT) to treat symptoms of menopause. Follow these instructions at home: Lifestyle Do not use any products that contain nicotine or tobacco, such as cigarettes, e-cigarettes, and chewing tobacco. If you need help quitting, ask your health care provider. Do not use street drugs. Do not share needles. Ask your health care provider for help if you need support or information about quitting drugs. Alcohol use Do not drink alcohol if: Your health care provider tells you not to drink. You are pregnant, may be pregnant, or are planning to become pregnant. If you drink alcohol: Limit how much you use to 0-1 drink a day. Limit intake if you are breastfeeding. Be aware of how much alcohol is in your drink. In the [place] one drink equals one 12 oz bottle of beer (355 mL), one 5 oz glass of wine (148 mL), or one 1 oz glass of hard liquor (44 mL). General instructions Schedule regular health, dental, and eye exams. Stay current with your vaccines. Tell your health care provider if: You often feel depressed. You have ever been abused or do not feel safe at home. Summary Adopting a healthy lifestyle and getting preventive care are important in promoting health and wellness. Follow your health care provider's instructions about healthy diet, exercising, and getting tested or screened for diseases. Follow your health care provider's  instructions on monitoring your cholesterol and blood pressure. This information is not intended to replace advice given to you by your health care provider. Make sure you discuss any questions you have with your health care provider. Document Revised: 09/04/2020 Document Reviewed: 06/20/2018 Elsevier Patient Education  2022 Elsevier Inc.  

## 2021-04-16 LAB — CERVICOVAGINAL ANCILLARY ONLY
Bacterial Vaginitis (gardnerella): POSITIVE — AB
Candida Glabrata: NEGATIVE
Candida Vaginitis: NEGATIVE
Chlamydia: NEGATIVE
Comment: NEGATIVE
Comment: NEGATIVE
Comment: NEGATIVE
Comment: NEGATIVE
Comment: NEGATIVE
Comment: NORMAL
Neisseria Gonorrhea: NEGATIVE
Trichomonas: NEGATIVE

## 2021-04-16 LAB — HEPATITIS C ANTIBODY: Hep C Virus Ab: <0.1 s/co ratio (ref 0.0–0.9)

## 2021-04-16 LAB — CYTOLOGY - PAP: Diagnosis: NEGATIVE

## 2021-04-16 LAB — HIV ANTIBODY (ROUTINE TESTING W REFLEX): HIV Screen 4th Generation wRfx: NONREACTIVE

## 2021-04-16 LAB — HEPATITIS B SURFACE ANTIGEN: Hepatitis B Surface Ag: NEGATIVE

## 2021-04-16 LAB — RPR: RPR Ser Ql: NONREACTIVE

## 2021-04-19 ENCOUNTER — Encounter: Payer: Self-pay | Admitting: *Deleted

## 2021-04-19 ENCOUNTER — Ambulatory Visit
Admission: RE | Admit: 2021-04-19 | Discharge: 2021-04-19 | Disposition: A | Discharge status: Home / Self Care | Payer: 59 | Source: Ambulatory Visit | Attending: Obstetrics and Gynecology | Admitting: Obstetrics and Gynecology

## 2021-04-19 ENCOUNTER — Other Ambulatory Visit: Payer: Self-pay | Admitting: *Deleted

## 2021-04-19 DIAGNOSIS — B9689 Other specified bacterial agents as the cause of diseases classified elsewhere: Secondary | ICD-10-CM

## 2021-04-19 DIAGNOSIS — G8929 Other chronic pain: Secondary | ICD-10-CM | POA: Diagnosis not present

## 2021-04-19 DIAGNOSIS — R102 Pelvic and perineal pain: Secondary | ICD-10-CM

## 2021-04-19 MED ORDER — METRONIDAZOLE 500 MG PO TABS: 500.0000 mg | ORAL_TABLET | Freq: Two times a day (BID) | ORAL | 0 refills | Status: DC

## 2021-04-19 NOTE — Progress Notes (Signed)
Flagyl RX sent. Patient education and message regarding RX sent via MyChart.

## 2021-04-22 ENCOUNTER — Encounter: Payer: Self-pay | Admitting: Nurse Practitioner

## 2021-04-22 ENCOUNTER — Other Ambulatory Visit: Payer: Self-pay

## 2021-04-22 ENCOUNTER — Ambulatory Visit (INDEPENDENT_AMBULATORY_CARE_PROVIDER_SITE_OTHER): Payer: 59 | Admitting: Nurse Practitioner

## 2021-04-22 VITALS — BP 105/68 | HR 72 | Temp 97.4°F | Ht 67.0 in | Wt 222.8 lb

## 2021-04-22 DIAGNOSIS — E669 Obesity, unspecified: Secondary | ICD-10-CM | POA: Diagnosis not present

## 2021-04-22 DIAGNOSIS — G43009 Migraine without aura, not intractable, without status migrainosus: Secondary | ICD-10-CM | POA: Diagnosis not present

## 2021-04-22 DIAGNOSIS — Z7689 Persons encountering health services in other specified circumstances: Secondary | ICD-10-CM

## 2021-04-22 LAB — POCT URINALYSIS DIP (CLINITEK)
Bilirubin, UA: NEGATIVE
Blood, UA: NEGATIVE
Glucose, UA: NEGATIVE mg/dL
Ketones, POC UA: NEGATIVE mg/dL
Leukocytes, UA: NEGATIVE
Nitrite, UA: NEGATIVE
POC PROTEIN,UA: NEGATIVE
Spec Grav, UA: 1.02 (ref 1.010–1.025)
Urobilinogen, UA: 0.2 E.U./dL
pH, UA: 7 (ref 5.0–8.0)

## 2021-04-22 NOTE — Patient Instructions (Signed)
Health Maintenance, Female Adopting a healthy lifestyle and getting preventive care are important in promoting health and wellness. Ask your health care provider about: The right schedule for you to have regular tests and exams. Things you can do on your own to prevent diseases and keep yourself healthy. What should I know about diet, weight, and exercise? Eat a healthy diet  Eat a diet that includes plenty of vegetables, fruits, low-fat dairy products, and lean protein. Do not eat a lot of foods that are high in solid fats, added sugars, or sodium. Maintain a healthy weight Body mass index (BMI) is used to identify weight problems. It estimates body fat based on height and weight. Your health care provider can help determine your BMI and help you achieve or maintain a healthy weight. Get regular exercise Get regular exercise. This is one of the most important things you can do for your health. Most adults should: Exercise for at least 150 minutes each week. The exercise should increase your heart rate and make you sweat (moderate-intensity exercise). Do strengthening exercises at least twice a week. This is in addition to the moderate-intensity exercise. Spend less time sitting. Even light physical activity can be beneficial. Watch cholesterol and blood lipids Have your blood tested for lipids and cholesterol at 25 years of age, then have this test every 5 years. Have your cholesterol levels checked more often if: Your lipid or cholesterol levels are high. You are older than 25 years of age. You are at high risk for heart disease. What should I know about cancer screening? Depending on your health history and family history, you may need to have cancer screening at various ages. This may include screening for: Breast cancer. Cervical cancer. Colorectal cancer. Skin cancer. Lung cancer. What should I know about heart disease, diabetes, and high blood pressure? Blood pressure and heart  disease High blood pressure causes heart disease and increases the risk of stroke. This is more likely to develop in people who have high blood pressure readings, are of African descent, or are overweight. Have your blood pressure checked: Every 3-5 years if you are 18-39 years of age. Every year if you are 40 years old or older. Diabetes Have regular diabetes screenings. This checks your fasting blood sugar level. Have the screening done: Once every three years after age 40 if you are at a normal weight and have a low risk for diabetes. More often and at a younger age if you are overweight or have a high risk for diabetes. What should I know about preventing infection? Hepatitis B If you have a higher risk for hepatitis B, you should be screened for this virus. Talk with your health care provider to find out if you are at risk for hepatitis B infection. Hepatitis C Testing is recommended for: Everyone born from 1945 through 1965. Anyone with known risk factors for hepatitis C. Sexually transmitted infections (STIs) Get screened for STIs, including gonorrhea and chlamydia, if: You are sexually active and are younger than 24 years of age. You are older than 24 years of age and your health care provider tells you that you are at risk for this type of infection. Your sexual activity has changed since you were last screened, and you are at increased risk for chlamydia or gonorrhea. Ask your health care provider if you are at risk. Ask your health care provider about whether you are at high risk for HIV. Your health care provider may recommend a prescription medicine   to help prevent HIV infection. If you choose to take medicine to prevent HIV, you should first get tested for HIV. You should then be tested every 3 months for as long as you are taking the medicine. Pregnancy If you are about to stop having your period (premenopausal) and you may become pregnant, seek counseling before you get  pregnant. Take 400 to 800 micrograms (mcg) of folic acid every day if you become pregnant. Ask for birth control (contraception) if you want to prevent pregnancy. Osteoporosis and menopause Osteoporosis is a disease in which the bones lose minerals and strength with aging. This can result in bone fractures. If you are 31 years old or older, or if you are at risk for osteoporosis and fractures, ask your health care provider if you should: Be screened for bone loss. Take a calcium or vitamin D supplement to lower your risk of fractures. Be given hormone replacement therapy (HRT) to treat symptoms of menopause. Follow these instructions at home: Lifestyle Do not use any products that contain nicotine or tobacco, such as cigarettes, e-cigarettes, and chewing tobacco. If you need help quitting, ask your health care provider. Do not use street drugs. Do not share needles. Ask your health care provider for help if you need support or information about quitting drugs. Alcohol use Do not drink alcohol if: Your health care provider tells you not to drink. You are pregnant, may be pregnant, or are planning to become pregnant. If you drink alcohol: Limit how much you use to 0-1 drink a day. Limit intake if you are breastfeeding. Be aware of how much alcohol is in your drink. In the [place] one drink equals one 12 oz bottle of beer (355 mL), one 5 oz glass of wine (148 mL), or one 1 oz glass of hard liquor (44 mL). General instructions Schedule regular health, dental, and eye exams. Stay current with your vaccines. Tell your health care provider if: You often feel depressed. You have ever been abused or do not feel safe at home. Summary Adopting a healthy lifestyle and getting preventive care are important in promoting health and wellness. Follow your health care provider's instructions about healthy diet, exercising, and getting tested or screened for diseases. Follow your health care provider's  instructions on monitoring your cholesterol and blood pressure. This information is not intended to replace advice given to you by your health care provider. Make sure you discuss any questions you have with your health care provider. Document Revised: 09/04/2020 Document Reviewed: 06/20/2018 Elsevier Patient Education  2022 Elsevier Inc.  Migraine Headache A migraine headache is a very strong throbbing pain on one side or both sides of your head. This type of headache can also cause other symptoms. It can last from 4 hours to 3 days. Talk with your doctor about what things may bring on (trigger) this condition. What are the causes? The exact cause of this condition is not known. This condition may be triggered or caused by: Drinking alcohol. Smoking. Taking medicines, such as: Medicine used to treat chest pain (nitroglycerin). Birth control pills. Estrogen. Some blood pressure medicines. Eating or drinking certain products. Doing physical activity. Other things that may trigger a migraine headache include: Having a menstrual period. Pregnancy. Hunger. Stress. Not getting enough sleep or getting too much sleep. Weather changes. Tiredness (fatigue). What increases the risk? Being 50-21 years old. Being female. Having a family history of migraine headaches. Being Caucasian. Having depression or anxiety. Being very overweight. What are the signs  or symptoms? A throbbing pain. This pain may: Happen in any area of the head, such as on one side or both sides. Make it hard to do daily activities. Get worse with physical activity. Get worse around bright lights or loud noises. Other symptoms may include: Feeling sick to your stomach (nauseous). Vomiting. Dizziness. Being sensitive to bright lights, loud noises, or smells. Before you get a migraine headache, you may get warning signs (an aura). An aura may include: Seeing flashing lights or having blind spots. Seeing bright  spots, halos, or zigzag lines. Having tunnel vision or blurred vision. Having numbness or a tingling feeling. Having trouble talking. Having weak muscles. Some people have symptoms after a migraine headache (postdromal phase), such as: Tiredness. Trouble thinking (concentrating). How is this treated? Taking medicines that: Relieve pain. Relieve the feeling of being sick to your stomach. Prevent migraine headaches. Treatment may also include: Having acupuncture. Avoiding foods that bring on migraine headaches. Learning ways to control your body functions (biofeedback). Therapy to help you know and deal with negative thoughts (cognitive behavioral therapy). Follow these instructions at home: Medicines Take over-the-counter and prescription medicines only as told by your doctor. Ask your doctor if the medicine prescribed to you: Requires you to avoid driving or using heavy machinery. Can cause trouble pooping (constipation). You may need to take these steps to prevent or treat trouble pooping: Drink enough fluid to keep your pee (urine) pale yellow. Take over-the-counter or prescription medicines. Eat foods that are high in fiber. These include beans, whole grains, and fresh fruits and vegetables. Limit foods that are high in fat and sugar. These include fried or sweet foods. Lifestyle Do not drink alcohol. Do not use any products that contain nicotine or tobacco, such as cigarettes, e-cigarettes, and chewing tobacco. If you need help quitting, ask your doctor. Get at least 8 hours of sleep every night. Limit and deal with stress. General instructions   Keep a journal to find out what may bring on your migraine headaches. For example, write down: What you eat and drink. How much sleep you get. Any change in what you eat or drink. Any change in your medicines. If you have a migraine headache: Avoid things that make your symptoms worse, such as bright lights. It may help to lie  down in a dark, quiet room. Do not drive or use heavy machinery. Ask your doctor what activities are safe for you. Keep all follow-up visits as told by your doctor. This is important. Contact a doctor if: You get a migraine headache that is different or worse than others you have had. You have more than 15 headache days in one month. Get help right away if: Your migraine headache gets very bad. Your migraine headache lasts longer than 72 hours. You have a fever. You have a stiff neck. You have trouble seeing. Your muscles feel weak or like you cannot control them. You start to lose your balance a lot. You start to have trouble walking. You pass out (faint). You have a seizure. Summary A migraine headache is a very strong throbbing pain on one side or both sides of your head. These headaches can also cause other symptoms. This condition may be treated with medicines and changes to your lifestyle. Keep a journal to find out what may bring on your migraine headaches. Contact a doctor if you get a migraine headache that is different or worse than others you have had. Contact your doctor if you have more  than 15 headache days in a month. This information is not intended to replace advice given to you by your health care provider. Make sure you discuss any questions you have with your health care provider. Document Revised: 10/19/2018 Document Reviewed: 08/09/2018 Elsevier Patient Education  2022 [employer].

## 2021-04-22 NOTE — Progress Notes (Signed)
[facility] Patient [facility] [address] [place], [place]  [address] Phone:  [phone]   Fax:  [phone]   New Patient Office Visit  Subjective:  Patient ID: Alejandra Bush, female    DOB: June 19, 1996  Age: 25 y.o. MRN: [id number]  CC:  Chief Complaint  Patient presents with   Establish Care    Pt is here to establish care. Pt states that she has been having migraines for years. Pt states she has been evaluated by a neurologist in the past but all the provider told her was she was stressing but no imaging has been done. Pt also states that she also notice x 2 weeks ago her blood pressure was elevated along with her migraines.    HPI Alejandra Bush presents to establish care. She  has a past medical history of Anxiety, Dizziness (03/28/2018), Migraine without aura and without status migrainosus, not intractable (11/18/2014), Palpitations (03/28/2018), Tension headache (11/18/2014), and Vitamin D deficiency (11/18/2014).   She reports being followed by neurology in the past for her migraines. She is seen by several other specialist, cardiology for past chest pain.  She is currently followed by GYN for annual exam.. She works with [employer] as a Psychologist, sport and exercise.   She has as prescription for Sertraline 50 mg. She reports that she is off it for now because she is controlling the anxiety other ways.  She walks 20254 steps per day,she enjoys painting, crocheting and listen to music.   Past Medical History:  Diagnosis Date   Anxiety    Dizziness 03/28/2018   Migraine without aura and without status migrainosus, not intractable 11/18/2014   Palpitations 03/28/2018   Tension headache 11/18/2014   Vitamin D deficiency 11/18/2014    Past Surgical History:  Procedure Laterality Date   NO PAST SURGERIES      Family History  Problem Relation Age of Onset   Healthy Mother    Healthy Father    Heart attack Neg Hx    Sudden death Neg Hx    Diabetes Neg Hx    Cancer  Neg Hx     Social History   Socioeconomic History   Marital status: Single    Spouse name: Not on file   Number of children: Not on file   Years of education: Not on file   Highest education level: Not on file  Occupational History   Not on file  Tobacco Use   Smoking status: Never   Smokeless tobacco: Never  Vaping Use   Vaping Use: Never used  Substance and Sexual Activity   Alcohol use: No   Drug use: No   Sexual activity: Not Currently    Birth control/protection: None  Other Topics Concern   Not on file  Social History Narrative   Not on file   Social Determinants of Health   Financial Resource Strain: Not on file  Food Insecurity: Not on file  Transportation Needs: Not on file  Physical Activity: Not on file  Stress: Not on file  Social Connections: Not on file  Intimate Partner Violence: Not on file    ROS Review of Systems  Constitutional: Negative.   HENT: Negative.    Eyes: Negative.   Respiratory: Negative.  Negative for shortness of breath.   Cardiovascular:  Negative for chest pain.  Gastrointestinal:  Positive for constipation (occasiona).  Endocrine: Negative.   Genitourinary: Negative.   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  Skin: Negative.  Allergic/Immunologic: Negative.   Neurological:  Positive for headaches (multiple times per week and using [employer] not as effective).  Hematological: Negative.    Objective:   Today's Vitals: BP 105/68   Pulse 72   Temp (!) 97.4 F (36.3 C)   Ht 5\' 7"  (1.702 m)   Wt 222 lb 12.8 oz (101.1 kg)   SpO2 100%   BMI 34.90 kg/m   Physical Exam Constitutional:      Appearance: She is obese.  HENT:     Head: Normocephalic and atraumatic.     Right Ear: Tympanic membrane normal.     Left Ear: Tympanic membrane normal.     Nose: Nose normal.     Mouth/Throat:     Mouth: Mucous membranes are moist.  Cardiovascular:     Rate and Rhythm: Normal rate and regular rhythm.      Pulses: Normal pulses.     Heart sounds: Normal heart sounds.  Pulmonary:     Effort: Pulmonary effort is normal.     Breath sounds: Normal breath sounds.  Abdominal:     General: Bowel sounds are normal.     Palpations: Abdomen is soft.  Musculoskeletal:        General: Normal range of motion.     Cervical back: Normal range of motion.     Right lower leg: No edema.     Left lower leg: No edema.  Skin:    General: Skin is warm and dry.     Capillary Refill: Capillary refill takes less than 2 seconds.  Neurological:     General: No focal deficit present.     Mental Status: She is alert and oriented to person, place, and time.  Psychiatric:        Mood and Affect: Mood normal.        Behavior: Behavior normal.        Thought Content: Thought content normal.        Judgment: Judgment normal.    Assessment & Plan:   Problem List Items Addressed This Visit       Cardiovascular and Mediastinum   Migraine without aura and without status migrainosus, not intractable Stable Encourage Excedrin Migraine   Other Visit Diagnoses     Encounter to establish care    -  Primary Discussed female health maintenance; SBE, annual CBE, PAP test Discussed general safety in vehicle and COVID Discussed regular hydration with water Discussed healthy diet and exercise and weight management Discussed sexual health  Discussed mental health Encouraged to call our office for an appointment with in ongoing concerns for questions.     Obesity (BMI 30.0-34.9)   Obesity with BMI and comorbidities as noted above.  Discussed proper diet (low fat, low sodium, high fiber) with patient.   Discussed need for regular exercise (3 times per week, 20 minutes per session) with patient.          Outpatient Encounter Medications as of 04/22/2021  Medication Sig   metroNIDAZOLE (FLAGYL) 500 MG tablet Take 1 tablet (500 mg total) by mouth 2 (two) times daily. (Patient not taking: Reported on 04/22/2021)    sertraline (ZOLOFT) 50 MG tablet Take 50 mg by mouth 3 (three) times daily. (Patient not taking: Reported on 04/22/2021)   No facility-administered encounter medications on file as of 04/22/2021.    Follow-up: Return in about 1 year (around 04/22/2022).   04/24/2022, NP

## 2021-04-23 ENCOUNTER — Ambulatory Visit: Payer: 59 | Admitting: Family Medicine

## 2021-05-14 ENCOUNTER — Ambulatory Visit (INDEPENDENT_AMBULATORY_CARE_PROVIDER_SITE_OTHER): Payer: 59 | Admitting: Obstetrics and Gynecology

## 2021-05-14 ENCOUNTER — Encounter: Payer: Self-pay | Admitting: Obstetrics and Gynecology

## 2021-05-14 ENCOUNTER — Other Ambulatory Visit: Payer: Self-pay

## 2021-05-14 VITALS — BP 118/70 | HR 71 | Wt 218.0 lb

## 2021-05-14 DIAGNOSIS — Z309 Encounter for contraceptive management, unspecified: Secondary | ICD-10-CM | POA: Insufficient documentation

## 2021-05-14 DIAGNOSIS — Z3202 Encounter for pregnancy test, result negative: Secondary | ICD-10-CM

## 2021-05-14 DIAGNOSIS — Z30011 Encounter for initial prescription of contraceptive pills: Secondary | ICD-10-CM | POA: Diagnosis not present

## 2021-05-14 DIAGNOSIS — Z3009 Encounter for other general counseling and advice on contraception: Secondary | ICD-10-CM

## 2021-05-14 DIAGNOSIS — R102 Pelvic and perineal pain: Secondary | ICD-10-CM | POA: Diagnosis not present

## 2021-05-14 LAB — POCT URINE PREGNANCY: Preg Test, Ur: NEGATIVE

## 2021-05-14 MED ORDER — DESOGESTREL-ETHINYL ESTRADIOL 0.15-30 MG-MCG PO TABS: 1.0000 | ORAL_TABLET | Freq: Every day | ORAL | 11 refills | Status: DC

## 2021-05-14 NOTE — Progress Notes (Signed)
Pt is here today for follow up u/s results and wants to discuss [facility] options.

## 2021-05-14 NOTE — Progress Notes (Signed)
Alejandra Bush presnts to discuss GYN U/S Results were noraml and reviewed with pt. Would like to try OCP's again as this has helped with her pelvic pain in the past. Discussed that do not have a specfic cause of her pain at this time.  PE AF VSS Lungs clear  Heart RRR Abd soft + Bs  A/P Contraception         Pelvic pain.  Will start OCP's. U/R/B and back up method discussed with pt. Start Sunday after next cycle.  Will f/u in 3-4 months

## 2021-05-26 ENCOUNTER — Ambulatory Visit (INDEPENDENT_AMBULATORY_CARE_PROVIDER_SITE_OTHER): Payer: 59 | Admitting: Nurse Practitioner

## 2021-05-26 ENCOUNTER — Other Ambulatory Visit: Payer: Self-pay

## 2021-05-26 ENCOUNTER — Encounter: Payer: Self-pay | Admitting: Nurse Practitioner

## 2021-05-26 VITALS — BP 136/65 | HR 80 | Temp 97.8°F | Ht 67.0 in | Wt 220.8 lb

## 2021-05-26 DIAGNOSIS — R5383 Other fatigue: Secondary | ICD-10-CM

## 2021-05-26 DIAGNOSIS — N644 Mastodynia: Secondary | ICD-10-CM

## 2021-05-26 NOTE — Patient Instructions (Addendum)
Breast Tenderness Breast tenderness is a common problem for women of all ages, but may also occur in men. Breast tenderness may range from mild discomfort to severe pain. In women, the pain usually comes and goes with the menstrual cycle, but it can also be constant. Breast tenderness has many possible causes, including hormone changes, infections, and taking certain medicines. You may have tests, such as a mammogram or an ultrasound, to check for any unusual findings. Having breast tenderness usually does not mean that you have breast cancer. Follow these instructions at home: Managing pain and discomfort  If directed, put ice to the painful area. To do this: Put ice in a plastic bag. Place a towel between your skin and the bag. Leave the ice on for 20 minutes, 2-3 times a day. Wear a supportive bra, especially during exercise. You may also want to wear a supportive bra while sleeping if your breasts are very tender. Medicines Take over-the-counter and prescription medicines only as told by your health care provider. If the cause of your pain is infection, you may be prescribed an antibiotic medicine. If you were prescribed an antibiotic, take it as told by your health care provider. Do not stop taking the antibiotic even if you start to feel better. Eating and drinking Your health care provider may recommend that you lessen the amount of fat in your diet. You can do this by: Limiting fried foods. Cooking foods using methods such as baking, boiling, grilling, and broiling. Decrease the amount of caffeine in your diet. Instead, drink more water and choose caffeine-free drinks. General instructions  Keep a log of the days and times when your breasts are most tender. Ask your health care provider how to do breast exams at home. This will help you notice if you have an unusual growth or lump. Keep all follow-up visits as told by your health care provider. This is important. Contact a health care  provider if: Any part of your breast is hard, red, and hot to the touch. This may be a sign of infection.Fatigue If you have fatigue, you feel tired all the time and have a lack of energy or a lack of motivation. Fatigue may make it difficult to start or complete tasks because of exhaustion. In general, occasional or mild fatigue is often a normal response to activity or life. However, long-lasting (chronic) or extreme fatigue may be a symptom of a medical condition. Follow these instructions at home: General instructions Watch your fatigue for any changes. Go to bed and get up at the same time every day. Avoid fatigue by pacing yourself during the day and getting enough sleep at night. Maintain a healthy weight. Medicines Take over-the-counter and prescription medicines only as told by your health care provider. Take a multivitamin, if told by your health care provider.  Do not use herbal or dietary supplements unless they are approved by your health care provider. Activity  Exercise regularly, as told by your health care provider. Use or practice techniques to help you relax, such as yoga, tai chi, meditation, or massage therapy. Eating and drinking  Avoid heavy meals in the evening. Eat a well-balanced diet, which includes lean proteins, whole grains, plenty of fruits and vegetables, and low-fat dairy products. Avoid consuming too much caffeine. Avoid the use of alcohol. Drink enough fluid to keep your urine pale yellow. Lifestyle Change situations that cause you stress. Try to keep your work and personal schedule in balance. Do not use any products  that contain nicotine or tobacco, such as cigarettes and e-cigarettes. If you need help quitting, ask your health care provider. Do not use drugs. Contact a health care provider if: Your fatigue does not get better. You have a fever. You suddenly lose or gain weight. You have headaches. You have trouble falling asleep or sleeping  through the night. You feel angry, guilty, anxious, or sad. You are unable to have a bowel movement (constipation). Your skin is dry. You have swelling in your legs or another part of your body. Get help right away if: You feel confused. Your vision is blurry. You feel faint or you pass out. You have a severe headache. You have severe pain in your abdomen, your back, or the area between your waist and hips (pelvis). You have chest pain, shortness of breath, or an irregular or fast heartbeat. You are unable to urinate, or you urinate less than normal. You have abnormal bleeding, such as bleeding from the rectum, vagina, nose, lungs, or nipples. You vomit blood. You have thoughts about hurting yourself or others. If you ever feel like you may hurt yourself or others, or have thoughts about taking your own life, get help right away. You can go to your nearest emergency department or call: Your local emergency services (911 in the [place] A suicide crisis helpline, such as the [place] at (239)323-2218 or 988 in the [place]. This is open 24 hours a day. Summary If you have fatigue, you feel tired all the time and have a lack of energy or a lack of motivation. Fatigue may make it difficult to start or complete tasks because of exhaustion. Long-lasting (chronic) or extreme fatigue may be a symptom of a medical condition. Exercise regularly, as told by your health care provider. Change situations that cause you stress. Try to keep your work and personal schedule in balance. This information is not intended to replace advice given to you by your health care provider. Make sure you discuss any questions you have with your health care provider. Document Revised: 01/20/2021 Document Reviewed: 05/07/2020 Elsevier Patient Education  2022 [employer]. and you have fluid, especially blood or pus, coming out of your nipples. Have a new or painful lump in your breast that  remains after your menstrual period ends. You have a fever. Your pain does not improve or it gets worse. Your pain is interfering with your daily activities. Summary Breast tenderness may range from mild discomfort to severe pain. Breast tenderness has many possible causes, including hormone changes, infections, and taking certain medicines. It can be treated with ice, wearing a supportive bra, and medicines. Make changes to your diet if told to by your health care provider. This information is not intended to replace advice given to you by your health care provider. Make sure you discuss any questions you have with your health care provider.   Document Revised: 11/19/2018 Document Reviewed: 11/19/2018 Elsevier Patient Education  [place].

## 2021-05-26 NOTE — Progress Notes (Signed)
[facility] Patient [facility] [address] [place], [place]  [address] Phone:  [phone]   Fax:  [phone]   Established Patient Office Visit  Subjective:  Patient ID: Alejandra Bush, female    DOB: Jun 11, 1996  Age: 25 y.o. MRN: [id number]  CC:  Chief Complaint  Patient presents with   Breast Pain    Pt is here today for breast pains x 4 months. Pt states that she was seen at health clinic at school and the provider wanted her to get a mammogram done but she never did. Pt also states that she has been feeling very fatigue.    HPI Alejandra Bush presents for follow up. She  has a past medical history of Anxiety, Dizziness (03/28/2018), Migraine without aura and without status migrainosus, not intractable (11/18/2014), Palpitations (03/28/2018), Tension headache (11/18/2014), and Vitamin D deficiency (11/18/2014).   She reports that she has not restarted the sertraline. She will restart when classes resume.   She reports that she has sharp pain in both breast. This has been going for several months. She has sharp shooting pain on the outer part of her breast. Initially started on the left however now in the right . The pain comes and goes. Denies this being related to cycle. She does have sore sensitive during MP. She has tried home breast exams however this makes her anxious. Denies family history of breast cancer.    Past Medical History:  Diagnosis Date   Anxiety    Dizziness 03/28/2018   Migraine without aura and without status migrainosus, not intractable 11/18/2014   Palpitations 03/28/2018   Tension headache 11/18/2014   Vitamin D deficiency 11/18/2014    Past Surgical History:  Procedure Laterality Date   NO PAST SURGERIES      Family History  Problem Relation Age of Onset   Healthy Mother    Healthy Father    Heart attack Neg Hx    Sudden death Neg Hx    Diabetes Neg Hx    Cancer Neg Hx     Social History   Socioeconomic History    Marital status: Single    Spouse name: Not on file   Number of children: Not on file   Years of education: Not on file   Highest education level: Not on file  Occupational History   Not on file  Tobacco Use   Smoking status: Never   Smokeless tobacco: Never  Vaping Use   Vaping Use: Never used  Substance and Sexual Activity   Alcohol use: No   Drug use: No   Sexual activity: Not Currently    Birth control/protection: None  Other Topics Concern   Not on file  Social History Narrative   Not on file   Social Determinants of Health   Financial Resource Strain: Not on file  Food Insecurity: Not on file  Transportation Needs: Not on file  Physical Activity: Not on file  Stress: Not on file  Social Connections: Not on file  Intimate Partner Violence: Not on file    Outpatient Medications Prior to Visit  Medication Sig Dispense Refill   desogestrel-ethinyl estradiol (APRI) 0.15-30 MG-MCG tablet Take 1 tablet by mouth daily. 28 tablet 11   sertraline (ZOLOFT) 50 MG tablet Take 50 mg by mouth 3 (three) times daily. (Patient not taking: No sig reported)     No facility-administered medications prior to visit.    No Known Allergies  ROS Review of Systems  Neurological:  Positive for headaches (3 days no treatment trying to cut coffee out.).     Objective:    Physical Exam Exam conducted with a chaperone present.  Constitutional:      Appearance: She is obese.  HENT:     Head: Normocephalic and atraumatic.  Cardiovascular:     Rate and Rhythm: Normal rate.     Pulses: Normal pulses.     Heart sounds: Normal heart sounds.  Pulmonary:     Effort: Pulmonary effort is normal.     Breath sounds: Normal breath sounds.  Chest:  Breasts:    Right: Normal.     Left: Normal.  Musculoskeletal:        General: Normal range of motion.  Lymphadenopathy:     Upper Body:     Right upper body: No supraclavicular, axillary or pectoral adenopathy.     Left upper body: No  supraclavicular, axillary or pectoral adenopathy.  Skin:    General: Skin is warm.     Capillary Refill: Capillary refill takes less than 2 seconds.  Neurological:     General: No focal deficit present.     Mental Status: She is alert and oriented to person, place, and time.  Psychiatric:        Mood and Affect: Mood normal.        Behavior: Behavior normal.        Thought Content: Thought content normal.    BP 136/65   Pulse 80   Temp 97.8 F (36.6 C)   Ht 5\' 7"  (1.702 m)   Wt 220 lb 12.8 oz (100.2 kg)   SpO2 100%   BMI 34.58 kg/m  Wt Readings from Last 3 Encounters:  05/26/21 220 lb 12.8 oz (100.2 kg)  05/14/21 218 lb (98.9 kg)  04/22/21 222 lb 12.8 oz (101.1 kg)     Health Maintenance Due  Topic Date Due   COVID-19 Vaccine (2 - Moderna series) 04/26/2020   INFLUENZA VACCINE  Never done    There are no preventive care reminders to display for this patient.  No results found for: TSH Lab Results  Component Value Date   WBC 7.3 10/09/2019   HGB 11.6 (L) 10/09/2019   HCT 37.2 10/09/2019   MCV 81.2 10/09/2019   PLT 237 10/09/2019   Lab Results  Component Value Date   NA 140 10/09/2019   K 3.4 (L) 10/09/2019   CO2 26 10/09/2019   GLUCOSE 83 10/09/2019   BUN 7 10/09/2019   CREATININE 0.79 10/09/2019   CALCIUM 9.0 10/09/2019   ANIONGAP 9 10/09/2019   No results found for: CHOL No results found for: HDL No results found for: LDLCALC No results found for: TRIG No results found for: CHOLHDL No results found for: 10/11/2019    Assessment & Plan:   Problem List Items Addressed This Visit   None Visit Diagnoses     Breast pain in female    -  Primary CBE completed [id number] breast pt requesting Mammo Educational information provided   Relevant Orders   US BREAST COMPLETE UNI LEFT INC AXILLA   US BREAST LTD UNI RIGHT INC AXILLA   Fatigue, unspecified type     Mild anemia Reevaluation pending   Relevant Orders   CBC with Differential/Platelet       No  orders of the defined types were placed in this encounter.   Follow-up: Return in about 1 year (around 05/26/2022) for follow up as needed.    Emagene Merfeld M  Brooke Dare, NP

## 2021-05-27 LAB — CBC WITH DIFFERENTIAL/PLATELET
Basophils Absolute: 0 10*3/uL (ref 0.0–0.2)
Basos: 0 %
EOS (ABSOLUTE): 0.2 10*3/uL (ref 0.0–0.4)
Eos: 2 %
Hematocrit: 40.2 % (ref 34.0–46.6)
Hemoglobin: 12.8 g/dL (ref 11.1–15.9)
Immature Grans (Abs): 0 10*3/uL (ref 0.0–0.1)
Immature Granulocytes: 0 %
Lymphocytes Absolute: 2.1 10*3/uL (ref 0.7–3.1)
Lymphs: 28 %
MCH: 24.7 pg — ABNORMAL LOW (ref 26.6–33.0)
MCHC: 31.8 g/dL (ref 31.5–35.7)
MCV: 78 fL — ABNORMAL LOW (ref 79–97)
Monocytes Absolute: 0.5 10*3/uL (ref 0.1–0.9)
Monocytes: 7 %
Neutrophils Absolute: 4.6 10*3/uL (ref 1.4–7.0)
Neutrophils: 63 %
Platelets: 309 10*3/uL (ref 150–450)
RBC: 5.18 x10E6/uL (ref 3.77–5.28)
RDW: 15.8 % — ABNORMAL HIGH (ref 11.7–15.4)
WBC: 7.4 10*3/uL (ref 3.4–10.8)

## 2021-06-16 ENCOUNTER — Encounter: Payer: Self-pay | Admitting: Obstetrics and Gynecology

## 2021-06-22 ENCOUNTER — Other Ambulatory Visit: Payer: 59

## 2021-07-07 DIAGNOSIS — S0990XA Unspecified injury of head, initial encounter: Secondary | ICD-10-CM | POA: Diagnosis not present

## 2021-07-07 DIAGNOSIS — R9431 Abnormal electrocardiogram [ECG] [EKG]: Secondary | ICD-10-CM | POA: Diagnosis not present

## 2021-07-07 DIAGNOSIS — M542 Cervicalgia: Secondary | ICD-10-CM | POA: Diagnosis not present

## 2021-07-07 DIAGNOSIS — R200 Anesthesia of skin: Secondary | ICD-10-CM | POA: Diagnosis not present

## 2021-07-07 DIAGNOSIS — R079 Chest pain, unspecified: Secondary | ICD-10-CM | POA: Diagnosis not present

## 2021-07-07 DIAGNOSIS — R569 Unspecified convulsions: Secondary | ICD-10-CM | POA: Diagnosis not present

## 2021-07-07 DIAGNOSIS — M79602 Pain in left arm: Secondary | ICD-10-CM | POA: Diagnosis not present

## 2021-07-07 DIAGNOSIS — R0902 Hypoxemia: Secondary | ICD-10-CM | POA: Diagnosis not present

## 2021-07-07 DIAGNOSIS — G8911 Acute pain due to trauma: Secondary | ICD-10-CM | POA: Diagnosis not present

## 2021-07-07 DIAGNOSIS — R55 Syncope and collapse: Secondary | ICD-10-CM | POA: Diagnosis not present

## 2021-07-07 DIAGNOSIS — R519 Headache, unspecified: Secondary | ICD-10-CM | POA: Diagnosis not present

## 2021-07-07 DIAGNOSIS — R0789 Other chest pain: Secondary | ICD-10-CM | POA: Diagnosis not present

## 2021-07-07 DIAGNOSIS — R531 Weakness: Secondary | ICD-10-CM | POA: Diagnosis not present

## 2021-07-07 DIAGNOSIS — R001 Bradycardia, unspecified: Secondary | ICD-10-CM | POA: Diagnosis not present

## 2021-07-07 DIAGNOSIS — Z79899 Other long term (current) drug therapy: Secondary | ICD-10-CM | POA: Diagnosis not present

## 2021-07-07 DIAGNOSIS — R0689 Other abnormalities of breathing: Secondary | ICD-10-CM | POA: Diagnosis not present

## 2021-07-07 DIAGNOSIS — R404 Transient alteration of awareness: Secondary | ICD-10-CM | POA: Diagnosis not present

## 2021-07-16 ENCOUNTER — Other Ambulatory Visit: Payer: Self-pay

## 2021-07-16 ENCOUNTER — Encounter: Payer: Self-pay | Admitting: Nurse Practitioner

## 2021-07-16 ENCOUNTER — Ambulatory Visit (INDEPENDENT_AMBULATORY_CARE_PROVIDER_SITE_OTHER): Payer: 59 | Admitting: Nurse Practitioner

## 2021-07-16 VITALS — BP 114/64 | HR 74 | Temp 98.1°F | Ht 67.0 in | Wt 216.2 lb

## 2021-07-16 DIAGNOSIS — Z1329 Encounter for screening for other suspected endocrine disorder: Secondary | ICD-10-CM | POA: Diagnosis not present

## 2021-07-16 DIAGNOSIS — E669 Obesity, unspecified: Secondary | ICD-10-CM | POA: Diagnosis not present

## 2021-07-16 DIAGNOSIS — D509 Iron deficiency anemia, unspecified: Secondary | ICD-10-CM

## 2021-07-16 DIAGNOSIS — G43009 Migraine without aura, not intractable, without status migrainosus: Secondary | ICD-10-CM | POA: Diagnosis not present

## 2021-07-16 DIAGNOSIS — R55 Syncope and collapse: Secondary | ICD-10-CM | POA: Diagnosis not present

## 2021-07-16 NOTE — Progress Notes (Signed)
[facility] Patient [facility] [address] [place], [place]  [id number] Phone:  [phone]   Fax:  [phone]   Established Patient Office Visit  Subjective:  Patient ID: Alejandra Bush, female    DOB: [birth year]  Age: 26 y.o. MRN: [id number]  CC:  Chief Complaint  Patient presents with   Hospitalization Follow-up    Pt is here today for her hospital follow up visit. Pt was in the hospital for syncope. Pt states that she has been having headaches off and on for awhile.     HPI Alejandra Bush presents for follow up. She  has a past medical history of Anxiety, Dizziness (03/28/2018), Migraine without aura and without status migrainosus, not intractable (11/18/2014), Palpitations (03/28/2018), Tension headache (11/18/2014), and Vitamin D deficiency (11/18/2014).   She is follow up for her hospital visit. She had a syncopal episode. [place] and her work up was negative. She was encourage to follow up with cardiology. She reports previous workup was negative. She does  have episodes with her heart feeling funny. She get left arm numbness and hyperventilating and then things get worse. She feels like she is having a migraine right now. She reports that she has had this since her childhood. She is still in school. She denies any new stressors. She reports being followed by neurologist however this was several years ago.  Past Medical History:  Diagnosis Date   Anxiety    Dizziness 03/28/2018   Migraine without aura and without status migrainosus, not intractable 11/18/2014   Palpitations 03/28/2018   Tension headache 11/18/2014   Vitamin D deficiency 11/18/2014    Past Surgical History:  Procedure Laterality Date   NO PAST SURGERIES      Family History  Problem Relation Age of Onset   Healthy Mother    Healthy Father    Heart attack Neg Hx    Sudden death Neg Hx    Diabetes Neg Hx    Cancer Neg Hx     Social History   Socioeconomic  History   Marital status: Single    Spouse name: Not on file   Number of children: Not on file   Years of education: Not on file   Highest education level: Not on file  Occupational History   Not on file  Tobacco Use   Smoking status: Never   Smokeless tobacco: Never  Vaping Use   Vaping Use: Never used  Substance and Sexual Activity   Alcohol use: No   Drug use: No   Sexual activity: Not Currently    Birth control/protection: None  Other Topics Concern   Not on file  Social History Narrative   Not on file   Social Determinants of Health   Financial Resource Strain: Not on file  Food Insecurity: Not on file  Transportation Needs: Not on file  Physical Activity: Not on file  Stress: Not on file  Social Connections: Not on file  Intimate Partner Violence: Not on file    Outpatient Medications Prior to Visit  Medication Sig Dispense Refill   desogestrel-ethinyl estradiol (APRI) 0.15-30 MG-MCG tablet Take 1 tablet by mouth daily. (Patient not taking: Reported on 07/16/2021) 28 tablet 11   sertraline (ZOLOFT) 50 MG tablet Take 50 mg by mouth 3 (three) times daily. (Patient not taking: Reported on 04/22/2021)     No facility-administered medications prior to visit.    No Known Allergies  ROS Review of Systems  Objective:    Physical Exam Constitutional:      Appearance: She is obese.  HENT:     Head: Normocephalic and atraumatic.     Nose: Nose normal.     Mouth/Throat:     Mouth: Mucous membranes are moist.  Cardiovascular:     Rate and Rhythm: Normal rate and regular rhythm.     Pulses: Normal pulses.     Heart sounds: Normal heart sounds.  Pulmonary:     Effort: Pulmonary effort is normal.     Breath sounds: Normal breath sounds.  Abdominal:     Palpations: Abdomen is soft.  Musculoskeletal:        General: Normal range of motion.     Cervical back: Normal range of motion and neck supple. No rigidity or tenderness.  Lymphadenopathy:     Cervical: No  cervical adenopathy.  Skin:    General: Skin is warm and dry.     Capillary Refill: Capillary refill takes less than 2 seconds.  Neurological:     General: No focal deficit present.     Mental Status: She is alert and oriented to person, place, and time.  Psychiatric:        Mood and Affect: Mood normal.        Behavior: Behavior normal.        Thought Content: Thought content normal.        Judgment: Judgment normal.    BP 114/64    Pulse 74    Temp 98.1 F (36.7 C)    Ht 5\' 7"  (1.702 m)    Wt 216 lb 3.2 oz (98.1 kg)    SpO2 100%    BMI 33.86 kg/m  Wt Readings from Last 3 Encounters:  07/16/21 216 lb 3.2 oz (98.1 kg)  05/26/21 220 lb 12.8 oz (100.2 kg)  05/14/21 218 lb (98.9 kg)     There are no preventive care reminders to display for this patient.   There are no preventive care reminders to display for this patient.  No results found for: TSH Lab Results  Component Value Date   WBC 7.4 05/26/2021   HGB 12.8 05/26/2021   HCT 40.2 05/26/2021   MCV 78 (L) 05/26/2021   PLT 309 05/26/2021   Lab Results  Component Value Date   NA 140 10/09/2019   K 3.4 (L) 10/09/2019   CO2 26 10/09/2019   GLUCOSE 83 10/09/2019   BUN 7 10/09/2019   CREATININE 0.79 10/09/2019   CALCIUM 9.0 10/09/2019   ANIONGAP 9 10/09/2019   No results found for: CHOL No results found for: HDL No results found for: LDLCALC No results found for: TRIG No results found for: CHOLHDL No results found for: 10/11/2019    Assessment & Plan:   Problem List Items Addressed This Visit       Cardiovascular and Mediastinum   Migraine without aura and without status migrainosus, not intractable Persistent  Reevaluation with neurologist    Relevant Orders   Ambulatory referral to Neurology   Other Visit Diagnoses     Syncope, unspecified syncope type    -  Primary Will consider cardiac monitor if neurologic workup is negative.    Relevant Orders   Ambulatory referral to Neurology   Obesity (BMI  30.0-34.9)     Obesity with BMI and comorbidities as noted above.  Discussed proper diet (low fat, low sodium, high fiber) with patient.   Discussed need for regular exercise (3 times per week, 20  minutes per session) with patient.    Iron deficiency anemia, unspecified iron deficiency anemia type       Relevant Orders   Iron, TIBC and Ferritin Panel   Screening for thyroid disorder       Relevant Orders   TSH       No orders of the defined types were placed in this encounter.   Follow-up: Return in about 3 months (around 10/14/2021) for reevaltion of IDA.    Barbette Merino, NP

## 2021-07-17 LAB — SPECIMEN STATUS

## 2021-07-24 LAB — IRON,TIBC AND FERRITIN PANEL
Ferritin: 21 ng/mL (ref 15–150)
Iron Saturation: 12 % — ABNORMAL LOW (ref 15–55)
Iron: 39 ug/dL (ref 27–159)
Total Iron Binding Capacity: 330 ug/dL (ref 250–450)
UIBC: 291 ug/dL (ref 131–425)

## 2021-07-24 LAB — TSH: TSH: 0.983 u[IU]/mL (ref 0.450–4.500)

## 2021-08-11 ENCOUNTER — Ambulatory Visit (INDEPENDENT_AMBULATORY_CARE_PROVIDER_SITE_OTHER): Payer: 59 | Admitting: Obstetrics and Gynecology

## 2021-08-11 ENCOUNTER — Encounter: Payer: Self-pay | Admitting: Obstetrics and Gynecology

## 2021-08-11 ENCOUNTER — Other Ambulatory Visit (HOSPITAL_COMMUNITY)
Admission: RE | Admit: 2021-08-11 | Discharge: 2021-08-11 | Disposition: A | Discharge status: Home / Self Care | Payer: 59 | Source: Ambulatory Visit | Attending: Obstetrics and Gynecology | Admitting: Obstetrics and Gynecology

## 2021-08-11 ENCOUNTER — Other Ambulatory Visit: Payer: Self-pay

## 2021-08-11 VITALS — BP 122/72 | HR 89 | Ht 67.0 in | Wt 219.4 lb

## 2021-08-11 DIAGNOSIS — Z202 Contact with and (suspected) exposure to infections with a predominantly sexual mode of transmission: Secondary | ICD-10-CM | POA: Diagnosis not present

## 2021-08-11 DIAGNOSIS — Z3041 Encounter for surveillance of contraceptive pills: Secondary | ICD-10-CM

## 2021-08-11 DIAGNOSIS — N946 Dysmenorrhea, unspecified: Secondary | ICD-10-CM | POA: Diagnosis not present

## 2021-08-11 LAB — POCT URINE PREGNANCY: Preg Test, Ur: NEGATIVE

## 2021-08-11 MED ORDER — TRANEXAMIC ACID 650 MG PO TABS: 1300.0000 mg | ORAL_TABLET | Freq: Three times a day (TID) | ORAL | 2 refills | Status: AC

## 2021-08-11 MED ORDER — DESOGESTREL-ETHINYL ESTRADIOL 0.15-30 MG-MCG PO TABS: 1.0000 | ORAL_TABLET | Freq: Every day | ORAL | 11 refills | Status: AC

## 2021-08-11 NOTE — Progress Notes (Signed)
Patient ID: Alejandra Bush, female   DOB: 06/28/1996, 26 y.o.   MRN: [id number] [name] presents f/u of her OCP's. Pt stopped taking her OCP's. Stated that they did not help and still had cramps with her cycle. Also desires STD.  GYN U/S normal  PE AF VSS Lungs clear Herat RRR Abd soft + BS  A/P Contraceptive management        STD exposure        Dysmenorrhea  UPT negative. Self swab for STD testing. Dicussed Tx options. Will try a different OCP and add TXA to each cycle. U/R/B reviewed. F/U in 3 months

## 2021-08-11 NOTE — Progress Notes (Signed)
Follow up. Stopped taking OCPs. Says was bleeding a lot. Stopped after 16 days of bleeding from 12/17 on.

## 2021-08-13 LAB — CERVICOVAGINAL ANCILLARY ONLY
Bacterial Vaginitis (gardnerella): POSITIVE — AB
Candida Glabrata: NEGATIVE
Candida Vaginitis: NEGATIVE
Comment: NEGATIVE
Comment: NEGATIVE
Comment: NEGATIVE

## 2021-08-16 ENCOUNTER — Other Ambulatory Visit: Payer: Self-pay | Admitting: *Deleted

## 2021-08-16 MED ORDER — METRONIDAZOLE 500 MG PO TABS: 500.0000 mg | ORAL_TABLET | Freq: Two times a day (BID) | ORAL | 0 refills | Status: DC

## 2021-08-16 NOTE — Progress Notes (Signed)
Flagyl sent for +BV See lab note 

## 2021-08-19 ENCOUNTER — Ambulatory Visit (INDEPENDENT_AMBULATORY_CARE_PROVIDER_SITE_OTHER): Payer: 59 | Admitting: Psychiatry

## 2021-08-19 ENCOUNTER — Encounter: Payer: Self-pay | Admitting: Psychiatry

## 2021-08-19 VITALS — BP 111/66 | HR 62 | Ht 67.0 in | Wt 219.2 lb

## 2021-08-19 DIAGNOSIS — R202 Paresthesia of skin: Secondary | ICD-10-CM

## 2021-08-19 DIAGNOSIS — G43109 Migraine with aura, not intractable, without status migrainosus: Secondary | ICD-10-CM

## 2021-08-19 DIAGNOSIS — R200 Anesthesia of skin: Secondary | ICD-10-CM

## 2021-08-19 DIAGNOSIS — R55 Syncope and collapse: Secondary | ICD-10-CM | POA: Diagnosis not present

## 2021-08-19 MED ORDER — RIZATRIPTAN BENZOATE 10 MG PO TABS: 10.0000 mg | ORAL_TABLET | ORAL | 3 refills | Status: DC | PRN

## 2021-08-19 MED ORDER — DULOXETINE HCL 30 MG PO CPEP: ORAL_CAPSULE | ORAL | 3 refills | Status: DC

## 2021-08-19 NOTE — Progress Notes (Signed)
Referring:  Barbette Merino, NP [address] [place],  [place] [address]  PCP: Barbette Merino, NP  Neurology was asked to evaluate Alejandra Bush, a 26 year old female for a chief complaint of headaches.  Our recommendations of care will be communicated by shared medical record.    CC:  headaches  HPI:  Medical co-morbidities: anxiety  The patient presents for evaluation of migraines which began in childhood. She is currently having migraines multiple times per weeks. Migraines are described as bilateral frontal/temporal throbbing and sharp pain in her temples. They are associated with photophobia and nausea. Migraines can last 2 days at a time.  Recently had a migraine which was associated with numbness in her left arm. She also had an episode of syncope with her headache.  She takes over the counter medications but they either don't work or only work for a short period of time.  Headache History: Onset: childhood Triggers: no Aura: blurry vision Location: temples, frontal Quality/Description: throbbing, sharp, pressure Associated Symptoms:  Photophobia: yes  Phonophobia: no  Nausea: yes Worse with activity?: yes Duration of headaches: 2 days  Headache days per month: 16 Headache free days per month: 14  Current Treatment: Abortive Tylenol, Advil  Preventative none  Prior Therapies                                 Zoloft 50 mg TID Amitriptyline 25 mg QHS  LABS: CBC    Component Value Date/Time   WBC WILL FOLLOW 07/16/2021 1506   WBC 7.3 10/09/2019 0124   RBC WILL FOLLOW 07/16/2021 1506   RBC 4.58 10/09/2019 0124   HGB WILL FOLLOW 07/16/2021 1506   HCT WILL FOLLOW 07/16/2021 1506   PLT WILL FOLLOW 07/16/2021 1506   MCV WILL FOLLOW 07/16/2021 1506   MCH WILL FOLLOW 07/16/2021 1506   MCH 25.3 (L) 10/09/2019 0124   MCHC WILL FOLLOW 07/16/2021 1506   MCHC 31.2 10/09/2019 0124   RDW WILL FOLLOW 07/16/2021 1506   LYMPHSABS WILL FOLLOW 07/16/2021  1506   EOSABS WILL FOLLOW 07/16/2021 1506   BASOSABS WILL FOLLOW 07/16/2021 1506   CMP Latest Ref Rng & Units 10/09/2019 04/05/2019  Glucose 70 - 99 mg/dL 83 88  BUN 6 - 20 mg/dL 7 17  Creatinine 0.86 - 1.00 mg/dL 5.78 4.69(G)  Sodium 295 - 145 mmol/L 140 140  Potassium 3.5 - 5.1 mmol/L 3.4(L) 4.0  Chloride 98 - 111 mmol/L 105 108  CO2 22 - 32 mmol/L 26 24  Calcium 8.9 - 10.3 mg/dL 9.0 9.3     IMAGING:  CT[facility] 07/07/21: normal   Current Outpatient Medications on File Prior to Visit  Medication Sig Dispense Refill   desogestrel-ethinyl estradiol (APRI) 0.15-30 MG-MCG tablet Take 1 tablet by mouth daily. 28 tablet 11   metroNIDAZOLE (FLAGYL) 500 MG tablet Take 1 tablet (500 mg total) by mouth 2 (two) times daily. 14 tablet 0   tranexamic acid (LYSTEDA) 650 MG TABS tablet Take 2 tablets (1,300 mg total) by mouth 3 (three) times daily. Take during menses for a maximum of five days 30 tablet 2   No current facility-administered medications on file prior to visit.     Allergies: No Known Allergies  Family History: Migraine or other headaches in the family:  aunt had migraines Aneurysms in a first degree relative:  no Brain tumors in the family:  no Other neurological illness  in the family:   no  Past Medical History: Past Medical History:  Diagnosis Date   Anxiety    Dizziness 03/28/2018   Migraine without aura and without status migrainosus, not intractable 11/18/2014   Palpitations 03/28/2018   Tension headache 11/18/2014   Vitamin D deficiency 11/18/2014    Past Surgical History Past Surgical History:  Procedure Laterality Date   NO PAST SURGERIES      Social History: Social History   Tobacco Use   Smoking status: Never   Smokeless tobacco: Never  Vaping Use   Vaping Use: Never used  Substance Use Topics   Alcohol use: No   Drug use: No     ROS: Negative for fevers, chills. Positive for headaches. All other systems reviewed and negative unless stated  otherwise in HPI.   Physical Exam:   Vital Signs: BP 111/66    Pulse 62    Ht 5\' 7"  (1.702 m)    Wt 219 lb 3.2 oz (99.4 kg)    BMI 34.33 kg/m  GENERAL: well appearing,in no acute distress,alert SKIN:  Color, texture, turgor normal. No rashes or lesions HEAD:  Normocephalic/atraumatic. CV:  RRR RESP: Normal respiratory effort MSK: no tenderness to palpation over occiput, neck, or shoulders  NEUROLOGICAL: Mental Status: Alert, oriented to person, place and time,Follows commands Cranial Nerves: PERRL,visual fields intact to confrontation,extraocular movements intact,facial sensation intact,no facial droop or ptosis,hearing grossly intact, no dysarthria Motor: muscle strength 5/5 both upper and lower extremities,no drift, normal tone Reflexes: 2+ throughout Sensation: intact to light touch all 4 extremities Coordination: Finger-to- nose-finger intact bilaterally Gait: normal-based   IMPRESSION: 26 year old female with a history of anxiety who presents for evaluation of migraines. Recently had an episode of numbness and syncope associated with her migraine. Will order MRI brain to rule out underlying structural causes given syncope associated with her headache. Discussed prevention options and she would prefer to try Cymbalta as she has tolerated antidepressants previously. Will start this for prevention. Maxalt started for rescue.  PLAN: -Prevention: Start Cymbalta 30 mg daily x1 week, then increase to 60 mg daily -Rescue: Start Maxalt 10 mg PRN -Next steps: consider zonisamide (she is concerned about Topamax side effects)   I spent a total of 24 minutes chart reviewing and counseling the patient. Headache education was done. Discussed treatment options including preventive and acute medications. Discussed medication overuse headache and to limit use of acute treatments to no more than 2 days/week or 10 days/month. Discussed medication side effects, adverse reactions and drug  interactions. Written educational materials and patient instructions outlining all of the above were given.  Follow-up: 3 months   22, MD 08/19/2021   10:37 AM

## 2021-08-19 NOTE — Patient Instructions (Signed)
Mri brain Start Maxalt as needed for migraine. Take at the onset of migraine. If headache recurs or does not fully resolve, you may take a second dose after 2 hours. Please avoid taking more than 2 days per week or 10 days per month. Start Cymbalta 30 mg daily for one week. If no side effects, can increase to 60 mg (2 pills) daily

## 2021-08-24 ENCOUNTER — Telehealth: Payer: Self-pay | Admitting: Psychiatry

## 2021-08-24 NOTE — Telephone Encounter (Signed)
LVM for pt to call back to schedule  [facility] auth: NPR ref # [id number]

## 2021-08-27 ENCOUNTER — Emergency Department (HOSPITAL_COMMUNITY): Payer: 59

## 2021-08-27 ENCOUNTER — Emergency Department (HOSPITAL_COMMUNITY)
Admission: EM | Admit: 2021-08-27 | Discharge: 2021-08-27 | Discharge status: Against Medical Advice | Payer: 59 | Attending: Emergency Medicine | Admitting: Emergency Medicine

## 2021-08-27 DIAGNOSIS — Z5321 Procedure and treatment not carried out due to patient leaving prior to being seen by health care provider: Secondary | ICD-10-CM | POA: Insufficient documentation

## 2021-08-27 DIAGNOSIS — I10 Essential (primary) hypertension: Secondary | ICD-10-CM | POA: Diagnosis not present

## 2021-08-27 DIAGNOSIS — R569 Unspecified convulsions: Secondary | ICD-10-CM | POA: Insufficient documentation

## 2021-08-27 DIAGNOSIS — R404 Transient alteration of awareness: Secondary | ICD-10-CM | POA: Diagnosis not present

## 2021-08-27 LAB — COMPREHENSIVE METABOLIC PANEL
ALT: 16 U/L (ref 0–44)
AST: 18 U/L (ref 15–41)
Albumin: 4 g/dL (ref 3.5–5.0)
Alkaline Phosphatase: 49 U/L (ref 38–126)
Anion gap: 10 (ref 5–15)
BUN: 7 mg/dL (ref 6–20)
CO2: 23 mmol/L (ref 22–32)
Calcium: 9.3 mg/dL (ref 8.9–10.3)
Chloride: 104 mmol/L (ref 98–111)
Creatinine, Ser: 0.95 mg/dL (ref 0.44–1.00)
GFR, Estimated: >60 mL/min (ref >60)
Glucose, Bld: 97 mg/dL (ref 70–99)
Potassium: 3.5 mmol/L (ref 3.5–5.1)
Sodium: 137 mmol/L (ref 135–145)
Total Bilirubin: 0.4 mg/dL (ref 0.3–1.2)
Total Protein: 7.2 g/dL (ref 6.5–8.1)

## 2021-08-27 LAB — CBC WITH DIFFERENTIAL/PLATELET
Abs Immature Granulocytes: 0.03 10*3/uL (ref 0.00–0.07)
Basophils Absolute: 0 10*3/uL (ref 0.0–0.1)
Basophils Relative: 0 %
Eosinophils Absolute: 0.1 10*3/uL (ref 0.0–0.5)
Eosinophils Relative: 1 %
HCT: 39.2 % (ref 36.0–46.0)
Hemoglobin: 12.6 g/dL (ref 12.0–15.0)
Immature Granulocytes: 0 %
Lymphocytes Relative: 20 %
Lymphs Abs: 2 10*3/uL (ref 0.7–4.0)
MCH: 25 pg — ABNORMAL LOW (ref 26.0–34.0)
MCHC: 32.1 g/dL (ref 30.0–36.0)
MCV: 77.9 fL — ABNORMAL LOW (ref 80.0–100.0)
Monocytes Absolute: 0.9 10*3/uL (ref 0.1–1.0)
Monocytes Relative: 9 %
Neutro Abs: 6.9 10*3/uL (ref 1.7–7.7)
Neutrophils Relative %: 70 %
Platelets: 278 10*3/uL (ref 150–400)
RBC: 5.03 MIL/uL (ref 3.87–5.11)
RDW: 16.1 % — ABNORMAL HIGH (ref 11.5–15.5)
WBC: 9.9 10*3/uL (ref 4.0–10.5)
nRBC: 0 % (ref 0.0–0.2)

## 2021-08-27 LAB — RAPID URINE DRUG SCREEN, HOSP PERFORMED
Amphetamines: NOT DETECTED
Barbiturates: NOT DETECTED
Benzodiazepines: NOT DETECTED
Cocaine: NOT DETECTED
Opiates: NOT DETECTED
Tetrahydrocannabinol: NOT DETECTED

## 2021-08-27 LAB — I-STAT CHEM 8, ED
BUN: 8 mg/dL (ref 6–20)
Calcium, Ion: 1.17 mmol/L (ref 1.15–1.40)
Chloride: 102 mmol/L (ref 98–111)
Creatinine, Ser: 0.8 mg/dL (ref 0.44–1.00)
Glucose, Bld: 85 mg/dL (ref 70–99)
HCT: 42 % (ref 36.0–46.0)
Hemoglobin: 14.3 g/dL (ref 12.0–15.0)
Potassium: 3.5 mmol/L (ref 3.5–5.1)
Sodium: 140 mmol/L (ref 135–145)
TCO2: 25 mmol/L (ref 22–32)

## 2021-08-27 LAB — MAGNESIUM: Magnesium: 2 mg/dL (ref 1.7–2.4)

## 2021-08-27 LAB — I-STAT BETA HCG BLOOD, ED (MC, WL, AP ONLY): I-stat hCG, quantitative: 9.7 m[IU]/mL — ABNORMAL HIGH (ref <5)

## 2021-08-27 LAB — ETHANOL: Alcohol, Ethyl (B): <10 mg/dL (ref <10)

## 2021-08-27 NOTE — ED Triage Notes (Signed)
Pt c/o "seizure-like activity," no hx of same. EMS reports "all arms, eyes rolling in the back of head. +Muscle tone, responded to painful stimuli." Pt A&O4, not post-ictal on arrival to ED.   VSS, NAD in triage.  20LAC

## 2021-08-27 NOTE — ED Provider Triage Note (Signed)
Emergency Medicine Provider Triage Evaluation Note  Alejandra Bush , a 26 y.o. female  was evaluated in triage.  Pt complains of seizure like activity which reportedly began tonight. Patient followed by Neurology for hx of migraine headaches. No hx of seizures, per chart review. Patient unwilling to answer questions.  Review of Systems  Positive: As above Negative: As above  Physical Exam  BP 127/85 (BP Location: Right Arm)    Pulse 80    Temp 98.9 F (37.2 C) (Oral)    Resp 15    SpO2 100%  Gen:   Awake, alert Resp:  Normal effort  MSK:   Moves extremities without difficulty  Other:  Intermittent blinking and rolling around of eyes with eyelid fluttering. Sporadic tremulous movements of b/l arms with normal tone. No decorticate or decerebrate posturing, involuntary muscle contractures. No tongue biting or incontinence.  Medical Decision Making  Medically screening exam initiated at 2:19 AM.  Appropriate orders placed.  Alejandra Bush was informed that the remainder of the evaluation will be completed by another provider, this initial triage assessment does not replace that evaluation, and the importance of remaining in the ED until their evaluation is complete.  Altered mental status - labs and head CT initiated for seizure evaluation, though bedside assessment highly suspicious for pseudoseizure. No focal deficits on exam.   Antony Madura, PA-C 08/27/21 0222

## 2021-08-27 NOTE — ED Notes (Signed)
PT opting to leave at this time 

## 2021-08-30 NOTE — Telephone Encounter (Signed)
Patient called back and she is scheduled for 09/01/21 at [facility].

## 2021-09-01 ENCOUNTER — Ambulatory Visit: Payer: 59

## 2021-09-01 DIAGNOSIS — R202 Paresthesia of skin: Secondary | ICD-10-CM | POA: Diagnosis not present

## 2021-09-01 DIAGNOSIS — R200 Anesthesia of skin: Secondary | ICD-10-CM | POA: Diagnosis not present

## 2021-09-01 DIAGNOSIS — R55 Syncope and collapse: Secondary | ICD-10-CM | POA: Diagnosis not present

## 2021-09-01 MED ORDER — GADOBENATE DIMEGLUMINE 529 MG/ML IV SOLN
20.0000 mL | Freq: Once | INTRAVENOUS | Status: AC | PRN
  Administered 2021-09-01: 20 mL via INTRAVENOUS

## 2021-09-15 ENCOUNTER — Other Ambulatory Visit: Payer: Self-pay | Admitting: Psychiatry

## 2021-10-10 ENCOUNTER — Encounter (HOSPITAL_COMMUNITY): Payer: Self-pay

## 2021-10-10 ENCOUNTER — Emergency Department (HOSPITAL_COMMUNITY)
Admission: EM | Admit: 2021-10-10 | Discharge: 2021-10-10 | Disposition: A | Discharge status: Home / Self Care | Payer: 59 | Attending: Emergency Medicine | Admitting: Emergency Medicine

## 2021-10-10 ENCOUNTER — Other Ambulatory Visit: Payer: Self-pay

## 2021-10-10 DIAGNOSIS — R52 Pain, unspecified: Secondary | ICD-10-CM | POA: Diagnosis not present

## 2021-10-10 DIAGNOSIS — R569 Unspecified convulsions: Secondary | ICD-10-CM | POA: Insufficient documentation

## 2021-10-10 DIAGNOSIS — R4189 Other symptoms and signs involving cognitive functions and awareness: Secondary | ICD-10-CM

## 2021-10-10 DIAGNOSIS — R404 Transient alteration of awareness: Secondary | ICD-10-CM | POA: Diagnosis not present

## 2021-10-10 DIAGNOSIS — R4020 Unspecified coma: Secondary | ICD-10-CM | POA: Diagnosis not present

## 2021-10-10 DIAGNOSIS — R9431 Abnormal electrocardiogram [ECG] [EKG]: Secondary | ICD-10-CM | POA: Diagnosis not present

## 2021-10-10 LAB — BASIC METABOLIC PANEL
Anion gap: 7 (ref 5–15)
BUN: 11 mg/dL (ref 6–20)
CO2: 25 mmol/L (ref 22–32)
Calcium: 9.3 mg/dL (ref 8.9–10.3)
Chloride: 106 mmol/L (ref 98–111)
Creatinine, Ser: 0.88 mg/dL (ref 0.44–1.00)
GFR, Estimated: >60 mL/min (ref >60)
Glucose, Bld: 94 mg/dL (ref 70–99)
Potassium: 4.1 mmol/L (ref 3.5–5.1)
Sodium: 138 mmol/L (ref 135–145)

## 2021-10-10 LAB — CBC
HCT: 39.3 % (ref 36.0–46.0)
Hemoglobin: 12.2 g/dL (ref 12.0–15.0)
MCH: 24.9 pg — ABNORMAL LOW (ref 26.0–34.0)
MCHC: 31 g/dL (ref 30.0–36.0)
MCV: 80.4 fL (ref 80.0–100.0)
Platelets: 300 10*3/uL (ref 150–400)
RBC: 4.89 MIL/uL (ref 3.87–5.11)
RDW: 16 % — ABNORMAL HIGH (ref 11.5–15.5)
WBC: 8.4 10*3/uL (ref 4.0–10.5)
nRBC: 0 % (ref 0.0–0.2)

## 2021-10-10 NOTE — ED Triage Notes (Signed)
Per [facility] staff, patient was twitching for 15 minutes prior to arrival via EMS. Per EMS, ammonia inhalant woke patient up and she started fighting and then patient answered all questions asked by EMS with A  O X4. Patient is not twitching an unresponsive in triage. Patient does not respond to painful stimuli ut stiffness up arms and legs when trying to move.  ?

## 2021-10-10 NOTE — ED Notes (Addendum)
Patient now awake and reaching for call bell and sitting up in bed. Responds to all questions, ?

## 2021-10-10 NOTE — ED Notes (Signed)
Patient requesting to leave and has ripped monitoring cords off of herself. Dr. Enzo Bi made aware and awaiting further response. ?

## 2021-10-10 NOTE — ED Provider Notes (Addendum)
?[facility] EM[facility]ENC[facility] DEPARTMENT ?Provider Note ? ? ?CSN: [id number] ?Arrival date & time: 10/10/21  1732 ? ?  ? ?History ? ?Chief Complaint  ?Patient presents with  ? Seizures  ? ? ?Alejandra Bush is a 26 y.o. female. ? ?Patient c/o possible seizure. Patient arrives via ems. EMS indicates pt was not responding normally on their arrival but did 'wake up' w exposure to ammonia inhalant, and was alert/oriented. No oral injury or incontinence.  Pt denies headache. No neck or back pain. No chest pain or discomfort. No sob or unusual doe. No palpitations. No abd pain or nvd. No dysuria or gu c/o. No extremity pain or injury. Normal appetite. No recent change in meds. No lightheadedness or dizziness. No change in speech or vision. No numbness/weakness. No problems w gait or balance.  ? ?The history is provided by the patient, medical records and the EMS personnel. The history is limited by the condition of the patient.  ?Seizures ? ?  ? ?Home Medications ?Prior to Admission medications   ?Medication Sig Start Date End Date Taking? Authorizing Provider  ?desogestrel-ethinyl estradiol (APRI) 0.15-30 MG-MCG tablet Take 1 tablet by mouth daily. 08/11/21 08/11/22  Hermina StaggersErvin, Michael L, MD  ?DULoxetine (CYMBALTA) 30 MG capsule TAKE 30 MG (1 PILL) DAILY FOR ONE WEEK, THEN INCREASE TO 60 MG (2 PILLS) DAILY 09/15/21   Ocie Doynehima, Jennifer, MD  ?metroNIDAZOLE (FLAGYL) 500 MG tablet Take 1 tablet (500 mg total) by mouth 2 (two) times daily. 08/16/21   Hermina StaggersErvin, Michael L, MD  ?rizatriptan (MAXALT) 10 MG tablet Take 1 tablet (10 mg total) by mouth as needed for migraine. May repeat in 2 hours if needed 08/19/21   Ocie Doynehima, Jennifer, MD  ?tranexamic acid (LYSTEDA) 650 MG TABS tablet Take 2 tablets (1,300 mg total) by mouth 3 (three) times daily. Take during menses for a maximum of five days 08/11/21   Hermina StaggersErvin, Michael L, MD  ?   ? ?Allergies    ?Patient has no known allergies.   ? ?Review of Systems   ?Review of Systems  ?Constitutional:  Negative for chills  and fever.  ?HENT:  Negative for sore throat.   ?Eyes:  Negative for redness and visual disturbance.  ?Respiratory:  Negative for shortness of breath.   ?Cardiovascular:  Negative for chest pain.  ?Gastrointestinal:  Negative for abdominal pain, diarrhea and vomiting.  ?Genitourinary:  Negative for dysuria and flank pain.  ?Musculoskeletal:  Negative for back pain and neck pain.  ?Skin:  Negative for rash.  ?Neurological:  Negative for weakness, numbness and headaches.  ?Hematological:  Does not bruise/bleed easily.  ?Psychiatric/Behavioral:  Negative for agitation.   ? ?Physical Exam ?Updated Vital Signs ?BP 119/70   Pulse 75   Temp 98.1 ?F (36.7 ?C) (Oral)   Resp 20   Ht 1.702 m (5\' 7" )   Wt 90 kg   SpO2 100%   BMI 31.08 kg/m?  ?Physical Exam ?Vitals and nursing note reviewed.  ?Constitutional:   ?   Appearance: Normal appearance. She is well-developed.  ?HENT:  ?   Head: Atraumatic.  ?   Nose: Nose normal.  ?   Mouth/Throat:  ?   Mouth: Mucous membranes are moist.  ?Eyes:  ?   General: No scleral icterus. ?   Conjunctiva/sclera: Conjunctivae normal.  ?   Pupils: Pupils are equal, round, and reactive to light.  ?Neck:  ?   Vascular: No carotid bruit.  ?   Trachea: No tracheal deviation.  ?  Comments: Normal movement/rom, no neck stiffness or rigidity.  ?Cardiovascular:  ?   Rate and Rhythm: Normal rate and regular rhythm.  ?   Pulses: Normal pulses.  ?   Heart sounds: Normal heart sounds. No murmur heard. ?  No friction rub. No gallop.  ?Pulmonary:  ?   Effort: Pulmonary effort is normal. No respiratory distress.  ?   Breath sounds: Normal breath sounds.  ?Abdominal:  ?   General: Bowel sounds are normal. There is no distension.  ?   Palpations: Abdomen is soft.  ?   Tenderness: There is no abdominal tenderness. There is no guarding.  ?Genitourinary: ?   Comments: No cva tenderness.  ?Musculoskeletal:     ?   General: No swelling or tenderness.  ?   Cervical back: Normal range of motion and neck supple. No  rigidity. No muscular tenderness.  ?Skin: ?   General: Skin is warm and dry.  ?   Findings: No rash.  ?Neurological:  ?   General: No focal deficit present.  ?   Mental Status: She is alert and oriented to person, place, and time.  ?   Cranial Nerves: No cranial nerve deficit.  ?   Comments: Alert, speech normal. GCS 15. Motor/sensation grossly intact bil. Steady gait.   ?Psychiatric:     ?   Mood and Affect: Mood normal.  ? ? ?ED Results / Procedures / Treatments   ?Labs ?(all labs ordered are listed, but only abnormal results are displayed) ?Results for orders placed or performed during the hospital encounter of 10/10/21  ?Basic metabolic panel  ?Result Value Ref Range  ? Sodium 138 135 - 145 mmol/L  ? Potassium 4.1 3.5 - 5.1 mmol/L  ? Chloride 106 98 - 111 mmol/L  ? CO2 25 22 - 32 mmol/L  ? Glucose, Bld 94 70 - 99 mg/dL  ? BUN 11 6 - 20 mg/dL  ? Creatinine, Ser 0.88 0.44 - 1.00 mg/dL  ? Calcium 9.3 8.9 - 10.3 mg/dL  ? GFR, Estimated >60 >60 mL/min  ? Anion gap 7 5 - 15  ?CBC  ?Result Value Ref Range  ? WBC 8.4 4.0 - 10.5 K/uL  ? RBC 4.89 3.87 - 5.11 MIL/uL  ? Hemoglobin 12.2 12.0 - 15.0 g/dL  ? HCT 39.3 36.0 - 46.0 %  ? MCV 80.4 80.0 - 100.0 fL  ? MCH 24.9 (L) 26.0 - 34.0 pg  ? MCHC 31.0 30.0 - 36.0 g/dL  ? RDW 16.0 (H) 11.5 - 15.5 %  ? Platelets 300 150 - 400 K/uL  ? nRBC 0.0 0.0 - 0.2 %  ? ? ? ?EKG ?EKG Interpretation ? ?Date/Time:  Sunday October 10 2021 17:39:46 EDT ?Ventricular Rate:  92 ?PR Interval:  134 ?QRS Duration: 86 ?QT Interval:  337 ?QTC Calculation: 417 ?R Axis:   79 ?Text Interpretation: Sinus rhythm Nonspecific T wave abnormality Confirmed by Cathren Laine ([id number]) on 10/10/2021 8:30:26 PM ? ?Radiology ?No results found. ? ?Procedures ?Procedures  ? ? ?Medications Ordered in ED ?Medications - No data to display ? ?ED Course/ Medical Decision Making/ A&P ?  ?                        ?Medical Decision Making ?Problems Addressed: ?Seizure-like activity [facility]): acute illness or injury with systemic  symptoms ?Unresponsive episode: acute illness or injury with systemic symptoms ? ?Amount and/or Complexity of Data Reviewed ?Independent Historian: EMS ?   Details: hx ?  External Data Reviewed: radiology and notes. ?Labs: ordered. Decision-making details documented in ED Course. ? ? ?Iv ns. Continuous pulse ox and cardiac monitoring. Labs ordered/sent. Imaging ordered.  ? ?Reviewed nursing notes and prior charts for additional history. External reports reviewed. Prior eval for 'seizure-like' activity noted - prior ct head and mri neg for acute process. Additional history from: EMS.  ? ?Cardiac monitor: sinus rhythm, rate 74.  ? ?Labs reviewed/interpreted by me - wbcd and hgb normal.  ? ?Recheck, fully awake and alert. Normal neuro exam. 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  ? ?Rec close outpt pcp/neurology f/u.  ? ?Return precautions provided.  ? ? ? ? ? ? ? ? ? ? ? ? ?Final Clinical Impression(s) / ED Diagnoses ?Final diagnoses:  ?None  ? ? ?Rx / DC Orders ?ED Discharge Orders   ? ? None  ? ?  ? ? ? ?  ?Cathren Laine, MD ?10/10/21 2031 ? ?

## 2021-10-10 NOTE — Discharge Instructions (Signed)
It was our pleasure to provide your ER care today - we hope that you feel better. ? ?Drink plenty of fluids/stay well hydrated. ? ?Follow up with neurologist in the next 1-2 weeks - call office Monday for appointment.  ? ?Return to ER if worse, new symptoms, fevers, new/severe pain, chest pain, trouble breathing, seizure, weak/fainting, or other concern.  ?

## 2021-10-11 ENCOUNTER — Telehealth: Payer: Self-pay | Admitting: Psychiatry

## 2021-10-11 ENCOUNTER — Other Ambulatory Visit: Payer: Self-pay | Admitting: Psychiatry

## 2021-10-11 DIAGNOSIS — G40909 Epilepsy, unspecified, not intractable, without status epilepticus: Secondary | ICD-10-CM

## 2021-10-11 NOTE — Telephone Encounter (Signed)
I placed an order for her to have an EEG. She should avoid driving until the EEG is complete. Thanks

## 2021-10-11 NOTE — Telephone Encounter (Signed)
Called patient and informed her Dr Delena Bali ordered EEG to check for seizure activity. I  advised she should not be driving as this is a real danger to herself and others on the road. I advised she'll get a call to schedule the EEG. Patient verbalized understanding, appreciation. ? ?

## 2021-10-11 NOTE — Telephone Encounter (Signed)
Patient came into the office today to change her May appt to something sooner. I was able to get her schedule for Thursday 4/6. ?Patient said Dr. Delena Bali advised if any changes or if anything happens to contact her immediately. The patient had 2 seizures yesterday 4/2 and she went to the ER and the Dr didn't do anything for her just checked her over. She said she had atleast 10 seizures since her last visit. 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 Delena Bali. Patient is concerned she may have more seizures and wanted to speak to Dr. Delena Bali if possible.  ?

## 2021-10-14 ENCOUNTER — Ambulatory Visit (INDEPENDENT_AMBULATORY_CARE_PROVIDER_SITE_OTHER): Payer: 59 | Admitting: Psychiatry

## 2021-10-14 ENCOUNTER — Other Ambulatory Visit (HOSPITAL_COMMUNITY): Payer: Self-pay

## 2021-10-14 ENCOUNTER — Encounter: Payer: Self-pay | Admitting: Psychiatry

## 2021-10-14 VITALS — BP 116/77 | HR 74 | Ht 67.0 in | Wt 208.2 lb

## 2021-10-14 DIAGNOSIS — G43109 Migraine with aura, not intractable, without status migrainosus: Secondary | ICD-10-CM | POA: Diagnosis not present

## 2021-10-14 DIAGNOSIS — R4020 Unspecified coma: Secondary | ICD-10-CM

## 2021-10-14 MED ORDER — METHOCARBAMOL 500 MG PO TABS
500.0000 mg | ORAL_TABLET | Freq: Two times a day (BID) | ORAL | 3 refills | Status: DC | PRN
  Filled 2021-10-14: qty 60, 30d supply, fill #0

## 2021-10-14 MED ORDER — DULOXETINE HCL 30 MG PO CPEP
30.0000 mg | ORAL_CAPSULE | Freq: Every day | ORAL | 3 refills | Status: AC
  Filled 2021-10-14: qty 30, 30d supply, fill #0
  Filled 2021-12-16: qty 30, 30d supply, fill #1
  Filled 2022-01-14: qty 30, 30d supply, fill #2

## 2021-10-14 MED ORDER — METHOCARBAMOL 500 MG PO TABS
500.0000 mg | ORAL_TABLET | Freq: Two times a day (BID) | ORAL | 3 refills | Status: AC | PRN
Start: 2021-10-14 — End: ?
  Filled 2021-10-14: qty 60, 30d supply, fill #0

## 2021-10-14 MED ORDER — ZOLMITRIPTAN 5 MG PO TABS
5.0000 mg | ORAL_TABLET | ORAL | 3 refills | Status: AC | PRN
  Filled 2021-10-14: qty 10, 30d supply, fill #0

## 2021-10-14 NOTE — Patient Instructions (Signed)
EEG ?Continue Cymbalta 30 mg daily ?Start Zomig as needed for migraines. Take at the onset of migraine. If headache recurs or does not fully resolve, you may take a second dose after 2 hours.  ?Start robaxin as needed for muscle cramps/spasms. Take up to two times a day as needed ?Please do not drive until you get the EEG ? ?4. Initiate non-pharmacologic measures at the earliest onset of your headache. ?Rest and quiet environment. ?Relax and reduce stress. Breathe2Relax is a free app that can instruct you on    some simple relaxtion and breathing techniques. Http://Dawnbuse.com is a    free website that provides teaching videos on relaxation.  Also, there are  many apps that   can be downloaded for ?mindful? relaxation.  An app called YOGA NIDRA will help walk you through mindfulness. Another app called Calm can be downloaded to give you a structured mindfulness guide with daily reminders and skill development. Headspace for guided meditation ?Mindfulness Based Stress Reduction Online Course: www.palousemindfulness.com ?Cold compresses. ? ?

## 2021-10-14 NOTE — Progress Notes (Signed)
? ?  CC:  headaches ? ?Follow-up Visit ? ?Last visit: 08/19/21 ? ?Brief HPI: ?26 year old female with a history of anxiety who follows in clinic for migraines. ? ?At her last visit she was started on Cymbalta for migraine prevention and Maxalt for rescue. ? ?Interval History: ?She presents today for ED follow up. States that four days ago she woke up and felt generally unwell. She then developed numbness in her left arm, then developed full body twitching and lost consciousness. EMS was called. States she was aware when EMS came but was unable to respond, like she was having an "out of body experience". EMS gave her ammonia which woke her up. States she was very agitated when she woke up. She then developed twitching in her arms again and started staring off into space. She remembers EMS tell her she needed to go to the hospital. She was taken to the ED where she started to respond again. States she was confused for a few minutes afterwards. Went home from the ED and had a third episode. ? ?States she has had 5 similar episodes in the past month.  ? ?In terms of her headaches, Cymbalta initially helped for the first week. She developed daily headaches when she increased the dose to 60 mg daily. Dropped down to 30 mg daily and headaches improved again. Ran out of Cymbalta one week ago. Maxalt did not help for rescue. ? ?Current Headache Regimen: ?Preventative: Cymbalta 30 mg daily ?Abortive: Maxalt 10 mg PRN ? ? ?Prior Therapies                                  ?Zoloft 50 mg TID ?Cymbalta ?Amitriptyline 25 mg QHS ?Maxalt 10 mg PRN ? ?Physical Exam:  ? ?Vital Signs: ?BP 116/77   Pulse 74   Ht 5\' 7"  (1.702 m)   Wt 208 lb 3.2 oz (94.4 kg)   BMI 32.61 kg/m?  ?GENERAL:  well appearing, in no acute distress, alert  ?SKIN:  Color, texture, turgor normal. No rashes or lesions ?HEAD:  Normocephalic/atraumatic. ?RESP: normal respiratory effort ?MSK:  No gross joint deformities.  ? ?NEUROLOGICAL: ?Mental Status: Alert,  oriented to person, place and time, Follows commands, and Speech fluent and appropriate. ?Cranial Nerves: PERRL, face symmetric, no dysarthria, hearing grossly intact ?Motor: moves all extremities equally ?Gait: normal-based. ? ?IMPRESSION: ?26 year old female with a history of anxiety who presents for follow up of migraines and episodes with loss of consciousness. There are aspects of her episodes which are suggestive of nonepileptic events including maintained awareness with inability to respond, as well as ability to regain awareness with ammonia inhalant. MRI brain in February was normal. She has never had an EEG. Will order EEG today. Counseled patient to avoid driving. Will continue Cymbalta and switch Maxalt to Zomig for rescue.  ? ?PLAN: ?-Ambulatory EEG ?-Headache prevention: Continue Cymbalta 30 mg daily ?-Headache rescue: Start Zomig 5 mg PRN, Robaxin 500 mg PRN for muscle cramps/spasms ?-next steps: consider Topamax/Zonisamide for headache prevention ? ?Follow-up: 4 months ? ?I spent a total of 57 minutes on the date of the service. Headache education was done. Discussed treatment options including preventive and acute medications. Discussed medication side effects, adverse reactions and drug interactions. Written educational materials and patient instructions outlining all of the above were given. ? ?March, MD ?10/14/21 ?12:36 PM ? ?

## 2021-10-25 ENCOUNTER — Ambulatory Visit (INDEPENDENT_AMBULATORY_CARE_PROVIDER_SITE_OTHER): Payer: 59 | Admitting: Neurology

## 2021-10-25 DIAGNOSIS — G40909 Epilepsy, unspecified, not intractable, without status epilepticus: Secondary | ICD-10-CM

## 2021-10-26 ENCOUNTER — Encounter: Payer: Self-pay | Admitting: *Deleted

## 2021-10-26 ENCOUNTER — Telehealth: Payer: Self-pay | Admitting: Psychiatry

## 2021-10-26 DIAGNOSIS — R4020 Unspecified coma: Secondary | ICD-10-CM | POA: Insufficient documentation

## 2021-10-26 DIAGNOSIS — G43109 Migraine with aura, not intractable, without status migrainosus: Secondary | ICD-10-CM | POA: Insufficient documentation

## 2021-10-26 MED ORDER — ZONISAMIDE 100 MG PO CAPS: 100.0000 mg | ORAL_CAPSULE | Freq: Every day | ORAL | 11 refills | Status: AC

## 2021-10-26 NOTE — Procedures (Signed)
. ? ? ?  History: ? ?26 year old woman with seizure vs. Syncope  ? ?EEG classification: Awake and drowsy ? ?Description of the recording: The background rhythms of this recording consists of a fairly well modulated medium amplitude alpha rhythm of 9-10 Hz that is reactive to eye opening and closure. As the record progresses, the patient appears to remain in the waking state throughout the recording. Photic stimulation was performed, did not show any abnormalities. Hyperventilation was also performed, did not show any abnormalities. Toward the end of the recording, the patient enters the drowsy state with slight symmetric slowing seen. The patient never enters stage II sleep. No abnormal epileptiform discharges seen during this recording. There was no focal slowing. EKG monitor shows no evidence of cardiac rhythm abnormalities with a heart rate of 66. At the end of the recording, patient had an event of eye flutter, but responded to sternal rub. No changes in EEG background other than eye movements  ? ?Abnormality: None  ? ?Impression: This is a normal EEG recording in the waking and drowsy state. No evidence of interictal epileptiform discharges seen. A normal EEG does not exclude a diagnosis of epilepsy.  ? ? ?Windell Norfolk, MD ?[facility] ?  ?

## 2021-10-26 NOTE — Telephone Encounter (Signed)
Spoke with patient and reviewed Dr Rhea Belton message with her in detail as well as new Rx. She stated she is already on medications prescribed by Dr Billey Gosling and they are working well to prevent her headaches. I advised she stay on them and not pick up new Rx.  ?She then stated Dr Billey Gosling wrote a letter to keep her out of work as [employer] certified [employer] until all testing was complete. She needs a letter stating she can return to work. I advised her I'll send to Dr Krista Blue and let her know if Dr Krista Blue agrees to write letter. Patient verbalized understanding, appreciation. ? ?

## 2021-10-26 NOTE — Telephone Encounter (Signed)
Pt would like a call back to discuss EEG results and next step going forward. ?

## 2021-10-26 NOTE — Telephone Encounter (Signed)
Chart reviewed, she was seen by Dr. Delena Bali October 14, 2021 for body shaking, confusion spells, out of body experience, frequent headaches ? ?MRI brain with and without contrast with normal. ?EEG was normal ? ?Dr. Delena Bali mentioned about Topamax, zonisamide for headache prevention, ? ?We will start zonisamide 100 mg every night, advised patient to document her headache, and spells ? ?Please also check with patient, make sure she is not pregnant, and remain on contraceptives while taking zonisamide 100 mg ? ? ? ? ?

## 2021-10-26 NOTE — Telephone Encounter (Signed)
Called patient and informed her Dr Terrace Arabia wrote letter for her to return to work. I advised it can be e mailed, she agreed. Letter was e mailed to her. ?

## 2021-10-27 ENCOUNTER — Ambulatory Visit: Payer: 59 | Admitting: Nurse Practitioner

## 2021-10-29 ENCOUNTER — Encounter: Payer: Self-pay | Admitting: Nurse Practitioner

## 2021-10-29 ENCOUNTER — Ambulatory Visit (INDEPENDENT_AMBULATORY_CARE_PROVIDER_SITE_OTHER): Payer: 59 | Admitting: Nurse Practitioner

## 2021-10-29 VITALS — BP 124/76 | HR 74 | Resp 18

## 2021-10-29 DIAGNOSIS — Z0000 Encounter for general adult medical examination without abnormal findings: Secondary | ICD-10-CM

## 2021-10-29 DIAGNOSIS — D509 Iron deficiency anemia, unspecified: Secondary | ICD-10-CM | POA: Insufficient documentation

## 2021-10-29 NOTE — Assessment & Plan Note (Signed)
-   Iron, TIBC and Ferritin Panel ? ? ?2. Routine health maintenance ? ?- TSH ?- Lipid Panel ? ? ?Follow up: ? ?Follow up in 6 months or sooner if needed ?

## 2021-10-29 NOTE — Patient Instructions (Addendum)
1. Iron deficiency anemia, unspecified iron deficiency anemia type ? ?- Iron, TIBC and Ferritin Panel ? ? ?2. Routine health maintenance ? ?- TSH ?- Lipid Panel ? ? ?Follow up: ? ?Follow up in 6 months or sooner if needed ? ?

## 2021-10-29 NOTE — Progress Notes (Signed)
@Patient  ID: Alejandra Bush, female    DOB: 1996-01-10, 26 y.o.   MRN: [id number] ? ?Chief Complaint  ?Patient presents with  ? Follow-up  ? ? ?Referring provider: ?[id number], NP ? ?HPI ? ?Patient presents today for follow-up visit.  She saw Alejandra Bush in January and was told to follow-up for anemia.  Since that time she has been seen in the ED for seizures.  She has been referred to neurology and has been following with them for this issue.  They are also following with her for migraines and syncope.  Overall since starting to follow-up with neurology patient has been stable and doing well.  We will recheck blood work today. Denies f/c/s, n/v/d, hemoptysis, PND, chest pain or edema. ? ? ? ? ? ?No Known Allergies ? ?Immunization History  ?Administered Date(s) Administered  ? Moderna SARS-COV2 Booster Vaccination 03/29/2020  ? Moderna Sars-Covid-2 Vaccination 11/30/2019  ? ? ?Past Medical History:  ?Diagnosis Date  ? Anxiety   ? Dizziness 03/28/2018  ? Migraine without aura and without status migrainosus, not intractable 11/18/2014  ? Palpitations 03/28/2018  ? Tension headache 11/18/2014  ? Vitamin D deficiency 11/18/2014  ? ? ?Tobacco History: ?Social History  ? ?Tobacco Use  ?Smoking Status Never  ?Smokeless Tobacco Never  ? ?Counseling given: Not Answered ? ? ?Outpatient Encounter Medications as of 10/29/2021  ?Medication Sig  ? desogestrel-ethinyl estradiol (APRI) 0.15-30 MG-MCG tablet Take 1 tablet by mouth daily.  ? DULoxetine (CYMBALTA) 30 MG capsule Take 1 capsule (30 mg total) by mouth daily.  ? methocarbamol (ROBAXIN) 500 MG tablet Take 1 tablet (500 mg total) by mouth 2 (two) times daily as needed for muscle spasms.  ? tranexamic acid (LYSTEDA) 650 MG TABS tablet Take 2 tablets (1,300 mg total) by mouth 3 (three) times daily. Take during menses for a maximum of five days  ? zolmitriptan (ZOMIG) 5 MG tablet Take 1 tablet (5 mg total) by mouth daily as needed for migraine.  May repeat once after  2 hours if needed. Max of 2 tablets in 24 hours.  ? zonisamide (ZONEGRAN) 100 MG capsule Take 1 capsule (100 mg total) by mouth daily.  ? ?No facility-administered encounter medications on file as of 10/29/2021.  ? ? ? ?Review of Systems ? ?Review of Systems  ?Constitutional: Negative.   ?HENT: Negative.    ?Cardiovascular: Negative.   ?Gastrointestinal: Negative.   ?Allergic/Immunologic: Negative.   ?Neurological: Negative.   ?Psychiatric/Behavioral: Negative.     ? ? ? ?Physical Exam ? ?BP 124/76   Pulse 74   Resp 18   SpO2 100%  ? ?Wt Readings from Last 5 Encounters:  ?10/14/21 208 lb 3.2 oz (94.4 kg)  ?10/10/21 198 lb 6.6 oz (90 kg)  ?08/19/21 219 lb 3.2 oz (99.4 kg)  ?08/11/21 219 lb 6.4 oz (99.5 kg)  ?07/16/21 216 lb 3.2 oz (98.1 kg)  ? ? ? ?Physical Exam ?Vitals and nursing note reviewed.  ?Constitutional:   ?   General: She is not in acute distress. ?   Appearance: She is well-developed.  ?Cardiovascular:  ?   Rate and Rhythm: Normal rate and regular rhythm.  ?Pulmonary:  ?   Effort: Pulmonary effort is normal.  ?   Breath sounds: Normal breath sounds.  ?Neurological:  ?   Mental Status: She is alert and oriented to person, place, and time.  ? ? ? ?Lab Results: ? ?CBC ?   ?Component Value Date/Time  ? WBC 8.4 10/10/2021 1800  ?  RBC 4.89 10/10/2021 1800  ? HGB 12.2 10/10/2021 1800  ? HGB WILL FOLLOW 07/16/2021 1506  ? HCT 39.3 10/10/2021 1800  ? HCT WILL FOLLOW 07/16/2021 1506  ? PLT 300 10/10/2021 1800  ? PLT WILL FOLLOW 07/16/2021 1506  ? MCV 80.4 10/10/2021 1800  ? MCV WILL FOLLOW 07/16/2021 1506  ? MCH 24.9 (L) 10/10/2021 1800  ? MCHC 31.0 10/10/2021 1800  ? RDW 16.0 (H) 10/10/2021 1800  ? RDW WILL FOLLOW 07/16/2021 1506  ? LYMPHSABS 2.0 08/27/2021 0225  ? LYMPHSABS WILL FOLLOW 07/16/2021 1506  ? MONOABS 0.9 08/27/2021 0225  ? EOSABS 0.1 08/27/2021 0225  ? EOSABS WILL FOLLOW 07/16/2021 1506  ? BASOSABS 0.0 08/27/2021 0225  ? BASOSABS WILL FOLLOW 07/16/2021 1506  ? ? ?BMET ?   ?Component Value Date/Time   ? NA 138 10/10/2021 1800  ? K 4.1 10/10/2021 1800  ? CL 106 10/10/2021 1800  ? CO2 25 10/10/2021 1800  ? GLUCOSE 94 10/10/2021 1800  ? BUN 11 10/10/2021 1800  ? CREATININE 0.88 10/10/2021 1800  ? CALCIUM 9.3 10/10/2021 1800  ? GFRNONAA >60 10/10/2021 1800  ? GFRAA >60 10/09/2019 0124  ? ? ?BNP ?No results found for: BNP ? ?ProBNP ?No results found for: PROBNP ? ?Imaging: ?EEG adult ? ?Result Date: 10/25/2021 ?Alejandra Norfolk, MD     10/26/2021  9:29 AM . History: 26 year old woman with seizure vs. Syncope EEG classification: Awake and drowsy Description of the recording: The background rhythms of this recording consists of a fairly well modulated medium amplitude alpha rhythm of 9-10 Hz that is reactive to eye opening and closure. As the record progresses, the patient appears to remain in the waking state throughout the recording. Photic stimulation was performed, did not show any abnormalities. Hyperventilation was also performed, did not show any abnormalities. Toward the end of the recording, the patient enters the drowsy state with slight symmetric slowing seen. The patient never enters stage II sleep. No abnormal epileptiform discharges seen during this recording. There was no focal slowing. EKG monitor shows no evidence of cardiac rhythm abnormalities with a heart rate of 66. At the end of the recording, patient had an event of eye flutter, but responded to sternal rub. No changes in EEG background other than eye movements Abnormality: None Impression: This is a normal EEG recording in the waking and drowsy state. No evidence of interictal epileptiform discharges seen. A normal EEG does not exclude a diagnosis of epilepsy. Alejandra Norfolk, MD [facility]    ? ? ?Assessment & Plan:  ? ?Iron deficiency anemia ?- Iron, TIBC and Ferritin Panel ? ? ?2. Routine health maintenance ? ?- TSH ?- Lipid Panel ? ? ?Follow up: ? ?Follow up in 6 months or sooner if needed ? ? ? ? ?Alejandra Andrew,  NP ?10/29/2021 ? ?

## 2021-10-30 LAB — TSH: TSH: 1.32 u[IU]/mL (ref 0.450–4.500)

## 2021-10-30 LAB — IRON,TIBC AND FERRITIN PANEL
Ferritin: 22 ng/mL (ref 15–150)
Iron Saturation: 13 % — ABNORMAL LOW (ref 15–55)
Iron: 43 ug/dL (ref 27–159)
Total Iron Binding Capacity: 325 ug/dL (ref 250–450)
UIBC: 282 ug/dL (ref 131–425)

## 2021-10-30 LAB — LIPID PANEL
Chol/HDL Ratio: 3.5 ratio (ref 0.0–4.4)
Cholesterol, Total: 121 mg/dL (ref 100–199)
HDL: 35 mg/dL — ABNORMAL LOW (ref >39)
LDL Chol Calc (NIH): 71 mg/dL (ref 0–99)
Triglycerides: 71 mg/dL (ref 0–149)
VLDL Cholesterol Cal: 15 mg/dL (ref 5–40)

## 2021-11-03 ENCOUNTER — Emergency Department (HOSPITAL_COMMUNITY)
Admission: EM | Admit: 2021-11-03 | Discharge: 2021-11-03 | Disposition: A | Discharge status: Home / Self Care | Payer: 59 | Attending: Emergency Medicine | Admitting: Emergency Medicine

## 2021-11-03 DIAGNOSIS — Z79899 Other long term (current) drug therapy: Secondary | ICD-10-CM | POA: Insufficient documentation

## 2021-11-03 DIAGNOSIS — R519 Headache, unspecified: Secondary | ICD-10-CM | POA: Diagnosis not present

## 2021-11-03 DIAGNOSIS — R569 Unspecified convulsions: Secondary | ICD-10-CM | POA: Insufficient documentation

## 2021-11-03 DIAGNOSIS — R000 Tachycardia, unspecified: Secondary | ICD-10-CM | POA: Diagnosis not present

## 2021-11-03 LAB — CBG MONITORING, ED: Glucose-Capillary: 97 mg/dL (ref 70–99)

## 2021-11-03 MED ORDER — ACETAMINOPHEN 325 MG PO TABS
650.0000 mg | ORAL_TABLET | Freq: Once | ORAL | Status: AC
  Administered 2021-11-03: 650 mg via ORAL
  Filled 2021-11-03: qty 2

## 2021-11-03 NOTE — ED Triage Notes (Signed)
Patient arrived to ED in POV; pt was removed from vehicle after a single 15 sec seizure. Eyes were rolled to back of head and legs were stiff. Pt not responding to voice commands. ?

## 2021-11-03 NOTE — ED Notes (Signed)
RN reviewed discharge instructions with pt. Pt verbalized understanding and had no further questions. VSS upon discharge.  

## 2021-11-03 NOTE — ED Provider Notes (Signed)
?[facility] EMERGENCY DEPARTMENT ?Provider Note ? ? ?CSN: [id number] ?Arrival date & time: 11/03/21  2058 ? ?  ? ?History ? ?Chief Complaint  ?Patient presents with  ? Seizures  ? ? ?Alejandra Bush is a 26 y.o. female. ? ?HPI ?Called to the room, for patient seizing at 9:08 PM.  At this time patient's eyes are rolled backwards, she is trembling bilaterally.  No respiratory distress.  She had increased tone in both arms and legs.  When I lifted her hands over her head, each of them dropped onto her face, when I let go.  When I sternal rub her, she purposely pushed me away, and rolled over onto her right side.  This appears to be a pseudoseizure.  Patient recently had negative MRI brain and negative EEG.  She is being managed by neurology. ?  ? ?Home Medications ?Prior to Admission medications   ?Medication Sig Start Date End Date Taking? Authorizing Provider  ?desogestrel-ethinyl estradiol (APRI) 0.15-30 MG-MCG tablet Take 1 tablet by mouth daily. 08/11/21 08/11/22  Hermina Staggers, MD  ?DULoxetine (CYMBALTA) 30 MG capsule Take 1 capsule (30 mg total) by mouth daily. 10/14/21   Ocie Doyne, MD  ?methocarbamol (ROBAXIN) 500 MG tablet Take 1 tablet (500 mg total) by mouth 2 (two) times daily as needed for muscle spasms. 10/14/21   Ocie Doyne, MD  ?tranexamic acid (LYSTEDA) 650 MG TABS tablet Take 2 tablets (1,300 mg total) by mouth 3 (three) times daily. Take during menses for a maximum of five days 08/11/21   Hermina Staggers, MD  ?zolmitriptan (ZOMIG) 5 MG tablet Take 1 tablet (5 mg total) by mouth daily as needed for migraine.  May repeat once after 2 hours if needed. Max of 2 tablets in 24 hours. 10/14/21   Ocie Doyne, MD  ?zonisamide (ZONEGRAN) 100 MG capsule Take 1 capsule (100 mg total) by mouth daily. 10/26/21   Levert Feinstein, MD  ?   ? ?Allergies    ?Patient has no known allergies.   ? ?Review of Systems   ?Review of Systems ? ?Physical Exam ?Updated Vital Signs ?BP 136/79 (BP Location:  Right Arm)   Pulse 92   Temp 98.3 ?F (36.8 ?C) (Oral)   Resp (!) 24   SpO2 99%  ?Physical Exam ? ?ED Results / Procedures / Treatments   ?Labs ?(all labs ordered are listed, but only abnormal results are displayed) ?Labs Reviewed  ?BASIC METABOLIC PANEL  ?CBC WITH DIFFERENTIAL/PLATELET  ?CBG MONITORING, ED  ? ? ?EKG ?EKG Interpretation ? ?Date/Time:  Wednesday November 03 2021 21:09:05 EDT ?Ventricular Rate:  106 ?PR Interval:  152 ?QRS Duration: 85 ?QT Interval:  320 ?QTC Calculation: 425 ?R Axis:   86 ?Text Interpretation: Sinus tachycardia Abnormal T, consider ischemia, anterior leads Baseline wander in lead(s) V3 Since last tracing rate faster Otherwise no significant change Confirmed by Mancel Bale [phone]) on 11/03/2021 9:22:57 PM ? ?Radiology ?No results found. ? ?Procedures ?Procedures  ? ? ?Medications Ordered in ED ?Medications  ?acetaminophen (TYLENOL) tablet 650 mg (650 mg Oral Given 11/03/21 2216)  ? ? ?ED Course/ Medical Decision Making/ A&P ?Clinical Course as of 11/03/21 2223  ?Wed Nov 03, 2021  ?2135 No seizing at this time.  She is resting comfortably.  Supportive individuals, friend and mother at bedside, give history at this time [EW]  ?  ?Clinical Course User Index ?[EW] Mancel Bale, MD  ? ?                        ?  Medical Decision Making ?Amount and/or Complexity of Data Reviewed ?Independent Historian: caregiver ?   Details: Patient unable to give history.  Support individuals at bedside states patient is having increased frequency of seizures recently, now 2/week.  This has been going on for several weeks, in the past would only occur about every 3 months.  Seizures sometimes are longer lasting 10 to 15 minutes and occasionally multiple episodes of shorter seizures.  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 ?External Data Reviewed: notes. ?   Details: She is being followed by neurology for episodes of shaking, and had a EEG on 10/25/2021 that was negative for  signs of seizure activity.  She also recently had an MRI of the brain that was negative.  She is also being treated by neurology for recurrent headaches and was started on zonisamide on 10/26/2021.  She was given a work to return to work on 10/26/2021 ?Labs: ordered. ?   Details: CBC, metabolic panel-patient left before labs resulted ? ?Risk ?OTC drugs. ?Decision regarding hospitalization. ?Risk Details: Shortly after patient was seen by me, initially, she stop shaking.  About half an hour after that she stood up and is she want to leave.  Family members at bedside with her at this time.  She requested pain medicine I offered Tylenol.  The person with her requested "something stronger." I did not change orders.  Anticipate follow-up with usual providers as outpatient.  Doubt seizure causing this trembling behavior.  Patient with chronic recurrent headaches.  Doubt acute intracranial abnormality at this time. ? ? ? ? ? ? ? ? ? ? ?Final Clinical Impression(s) / ED Diagnoses ?Final diagnoses:  ?Seizure-like activity (HCC)  ?Nonintractable headache, unspecified chronicity pattern, unspecified headache type  ? ? ?Rx / DC Orders ?ED Discharge Orders   ? ? None  ? ?  ? ? ?  ?Mancel Bale, MD ?11/03/21 2223 ? ?

## 2021-11-03 NOTE — Discharge Instructions (Addendum)
Follow-up with your regular doctors for further care and treatment as needed. ?

## 2021-11-08 ENCOUNTER — Telehealth: Payer: Self-pay | Admitting: Psychiatry

## 2021-11-08 NOTE — Telephone Encounter (Signed)
Sent mychart message to the pt on possible scheduled for 12/08/2021 at 200 pm.  ?

## 2021-11-08 NOTE — Telephone Encounter (Signed)
Pt would like a call from the nurse to discuss a sooner appt. Have had 2 seizures:Monday, 4/24 and Wednesday 4/27. ?

## 2021-11-09 NOTE — Telephone Encounter (Signed)
I called the pt and discussed the possibility of scheduling f/u and pt has been scheduled for 12/07/2021 at 930 am.  ?

## 2021-11-12 DIAGNOSIS — G4089 Other seizures: Secondary | ICD-10-CM | POA: Diagnosis not present

## 2021-11-12 DIAGNOSIS — R569 Unspecified convulsions: Secondary | ICD-10-CM | POA: Diagnosis not present

## 2021-11-13 DIAGNOSIS — R569 Unspecified convulsions: Secondary | ICD-10-CM | POA: Diagnosis not present

## 2021-11-14 DIAGNOSIS — R569 Unspecified convulsions: Secondary | ICD-10-CM | POA: Diagnosis not present

## 2021-11-15 DIAGNOSIS — G40909 Epilepsy, unspecified, not intractable, without status epilepticus: Secondary | ICD-10-CM | POA: Diagnosis not present

## 2021-11-22 DIAGNOSIS — R404 Transient alteration of awareness: Secondary | ICD-10-CM | POA: Diagnosis not present

## 2021-11-22 DIAGNOSIS — R1030 Lower abdominal pain, unspecified: Secondary | ICD-10-CM | POA: Diagnosis not present

## 2021-11-22 DIAGNOSIS — R569 Unspecified convulsions: Secondary | ICD-10-CM | POA: Diagnosis not present

## 2021-11-22 DIAGNOSIS — R109 Unspecified abdominal pain: Secondary | ICD-10-CM | POA: Diagnosis not present

## 2021-11-22 DIAGNOSIS — G40011 Localization-related (focal) (partial) idiopathic epilepsy and epileptic syndromes with seizures of localized onset, intractable, with status epilepticus: Secondary | ICD-10-CM | POA: Diagnosis not present

## 2021-11-22 DIAGNOSIS — Z79899 Other long term (current) drug therapy: Secondary | ICD-10-CM | POA: Diagnosis not present

## 2021-11-22 DIAGNOSIS — R4182 Altered mental status, unspecified: Secondary | ICD-10-CM | POA: Diagnosis not present

## 2021-11-23 DIAGNOSIS — R4182 Altered mental status, unspecified: Secondary | ICD-10-CM | POA: Diagnosis not present

## 2021-11-23 DIAGNOSIS — R404 Transient alteration of awareness: Secondary | ICD-10-CM | POA: Diagnosis not present

## 2021-11-23 DIAGNOSIS — R569 Unspecified convulsions: Secondary | ICD-10-CM | POA: Diagnosis not present

## 2021-11-23 DIAGNOSIS — R109 Unspecified abdominal pain: Secondary | ICD-10-CM | POA: Diagnosis not present

## 2021-11-23 DIAGNOSIS — R1030 Lower abdominal pain, unspecified: Secondary | ICD-10-CM | POA: Diagnosis not present

## 2021-11-23 DIAGNOSIS — G40011 Localization-related (focal) (partial) idiopathic epilepsy and epileptic syndromes with seizures of localized onset, intractable, with status epilepticus: Secondary | ICD-10-CM | POA: Diagnosis not present

## 2021-11-23 DIAGNOSIS — Z79899 Other long term (current) drug therapy: Secondary | ICD-10-CM | POA: Diagnosis not present

## 2021-11-29 ENCOUNTER — Telehealth: Payer: Self-pay | Admitting: Psychiatry

## 2021-11-29 NOTE — Telephone Encounter (Signed)
error 

## 2021-12-01 NOTE — Telephone Encounter (Signed)
I finished the paper work. Thanks.

## 2021-12-02 ENCOUNTER — Ambulatory Visit: Payer: 59 | Admitting: Psychiatry

## 2021-12-07 ENCOUNTER — Encounter: Payer: Self-pay | Admitting: Psychiatry

## 2021-12-07 ENCOUNTER — Ambulatory Visit: Payer: 59 | Admitting: Psychiatry

## 2021-12-07 NOTE — Progress Notes (Deleted)
   CC:  headaches  Follow-up Visit  Last visit: 10/14/21  Brief HPI: 26 year old female with a history of anxiety who follows in clinic for migraines and episodes of seizure-like activity.  At her last visit, routine EEG was done. She was started on Zomig for migraine rescue and Robaxin for muscle spasms.  Interval History: EEG 10/24/21 was normal. She continued to have headaches and was started on Zonisamide 100 mg QHS for headache prevention.  She presented to the ED 11/03/21 for another seizure-like episode. There was suspicion for non-epileptic seizure in the ED as she responded to sternal rub during one of her episodes.   Headache days per month: *** Headache free days per month: *** Headache severity: ***  Current Headache Regimen: Preventative: Cymbalta 30 mg daily, zonisamide 100 mg daily*** Abortive: Zomig 5 mg pRN, robaxin 500 mg PRN for muscle spasms   Prior Therapies                                  Zoloft 50 mg TID Cymbalta Amitriptyline 25 mg QHS Zonisamide 100 mg daily Maxalt 10 mg PRN   Physical Exam:   Vital Signs: There were no vitals taken for this visit. GENERAL:  well appearing, in no acute distress, alert  SKIN:  Color, texture, turgor normal. No rashes or lesions HEAD:  Normocephalic/atraumatic. RESP: normal respiratory effort MSK:  No gross joint deformities.   NEUROLOGICAL: Mental Status: Alert, oriented to person, place and time, Follows commands, and Speech fluent and appropriate. Cranial Nerves: PERRL, face symmetric, no dysarthria, hearing grossly intact Motor: moves all extremities equally Gait: normal-based.  IMPRESSION: ***  PLAN: ***   Follow-up: ***  I spent a total of *** minutes on the date of the service. Headache education was done. Discussed lifestyle modification including increased oral hydration, decreased caffeine, exercise and stress management. Discussed treatment options including preventive and acute medications,  natural supplements, and infusion therapy. Discussed medication overuse headache and to limit use of acute treatments to no more than 2 days/week or 10 days/month. Discussed medication side effects, adverse reactions and drug interactions. Written educational materials and patient instructions outlining all of the above were given.  Genia Harold, MD

## 2021-12-10 DIAGNOSIS — G40909 Epilepsy, unspecified, not intractable, without status epilepticus: Secondary | ICD-10-CM | POA: Diagnosis not present

## 2021-12-10 DIAGNOSIS — J329 Chronic sinusitis, unspecified: Secondary | ICD-10-CM | POA: Diagnosis not present

## 2021-12-16 ENCOUNTER — Other Ambulatory Visit (HOSPITAL_COMMUNITY): Payer: Self-pay

## 2021-12-16 MED ORDER — LEVETIRACETAM 250 MG PO TABS
250.0000 mg | ORAL_TABLET | Freq: Two times a day (BID) | ORAL | 0 refills | Status: AC
  Filled 2021-12-16: qty 60, 30d supply, fill #0
  Filled 2021-12-16: qty 120, 60d supply, fill #0

## 2022-01-04 NOTE — Progress Notes (Signed)
Erroneous encounter-disregard

## 2022-01-14 ENCOUNTER — Encounter: Payer: 59 | Admitting: Family

## 2022-01-14 ENCOUNTER — Other Ambulatory Visit (HOSPITAL_COMMUNITY): Payer: Self-pay

## 2022-01-14 DIAGNOSIS — Z7689 Persons encountering health services in other specified circumstances: Secondary | ICD-10-CM

## 2022-01-31 ENCOUNTER — Other Ambulatory Visit: Payer: 59

## 2022-02-17 ENCOUNTER — Ambulatory Visit: Payer: Self-pay | Admitting: Psychiatry

## 2022-02-21 ENCOUNTER — Ambulatory Visit (INDEPENDENT_AMBULATORY_CARE_PROVIDER_SITE_OTHER): Payer: Self-pay | Admitting: Obstetrics and Gynecology

## 2022-02-21 ENCOUNTER — Other Ambulatory Visit (HOSPITAL_COMMUNITY)
Admission: RE | Admit: 2022-02-21 | Discharge: 2022-02-21 | Disposition: A | Discharge status: Home / Self Care | Payer: Self-pay | Source: Ambulatory Visit | Attending: Obstetrics and Gynecology | Admitting: Obstetrics and Gynecology

## 2022-02-21 VITALS — BP 122/74 | HR 84 | Ht 67.0 in | Wt 225.8 lb

## 2022-02-21 DIAGNOSIS — N898 Other specified noninflammatory disorders of vagina: Secondary | ICD-10-CM | POA: Insufficient documentation

## 2022-02-21 LAB — POCT URINALYSIS DIPSTICK
Bilirubin, UA: NEGATIVE
Blood, UA: NEGATIVE
Glucose, UA: NEGATIVE
Ketones, UA: NEGATIVE
Leukocytes, UA: NEGATIVE
Nitrite, UA: NEGATIVE
Protein, UA: NEGATIVE
Spec Grav, UA: 1.015 (ref 1.010–1.025)
Urobilinogen, UA: 0.2 E.U./dL
pH, UA: 6 (ref 5.0–8.0)

## 2022-02-21 NOTE — Progress Notes (Addendum)
SUBJECTIVE:  26 y.o. female complains of white vaginal discharge for a few day(s). Denies abnormal vaginal bleeding or significant pelvic pain or fever. No UTI symptoms. Denies history of known exposure to STD.  Patient's last menstrual period was 02/03/2022 (exact date).  OBJECTIVE:  She appears well, afebrile. Urine dipstick: negative for all components.  ASSESSMENT:  Vaginal Discharge Itching around vulva   PLAN:  GC, chlamydia, trichomonas, BVAG, CVAG probe sent to lab. Treatment: To be determined once lab results are received ROV prn if symptoms persist or worsen.

## 2022-02-23 LAB — CERVICOVAGINAL ANCILLARY ONLY
Bacterial Vaginitis (gardnerella): POSITIVE — AB
Candida Glabrata: NEGATIVE
Candida Vaginitis: NEGATIVE
Chlamydia: NEGATIVE
Comment: NEGATIVE
Comment: NEGATIVE
Comment: NEGATIVE
Comment: NEGATIVE
Comment: NEGATIVE
Comment: NORMAL
Neisseria Gonorrhea: NEGATIVE
Trichomonas: NEGATIVE

## 2022-02-23 LAB — URINE CULTURE

## 2022-02-24 MED ORDER — METRONIDAZOLE 500 MG PO TABS: 500.0000 mg | ORAL_TABLET | Freq: Two times a day (BID) | ORAL | 0 refills | Status: AC

## 2022-02-24 NOTE — Addendum Note (Signed)
Addended by: Catalina Antigua on: 02/24/2022 09:11 AM   Modules accepted: Orders

## 2022-04-22 ENCOUNTER — Ambulatory Visit: Payer: 59 | Admitting: Nurse Practitioner

## 2022-08-09 IMAGING — CT CT HEAD W/O CM
4 series · 17 of 47 positions shown, 19 images · non-contrast
Comparison: None.

CLINICAL DATA: Seizure like activity



[Series 3: head wo · axial · 0.40mm/px · z∈[-93,+27]mm · 7 of 32 slices shown, 9 images]
[im 4/32  brain]
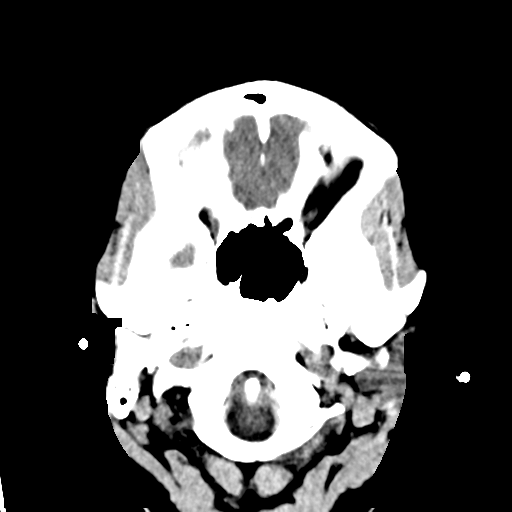
[im 4/32  bone]
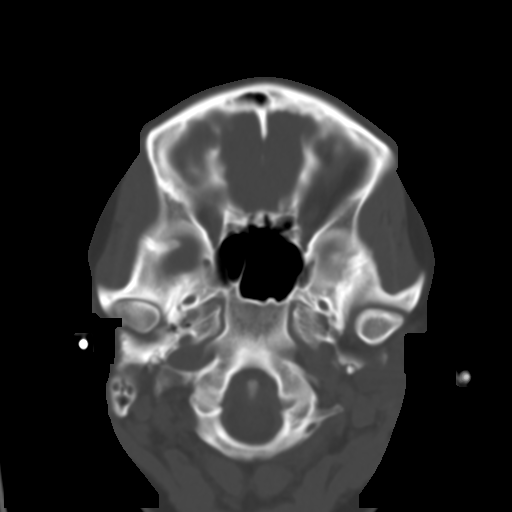
[im 8/32  brain]
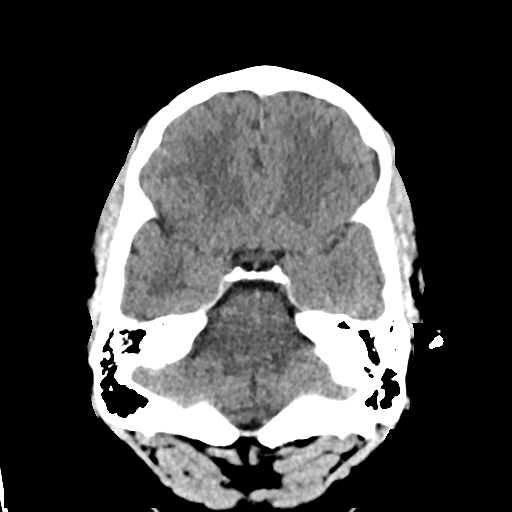
[im 12/32  brain]
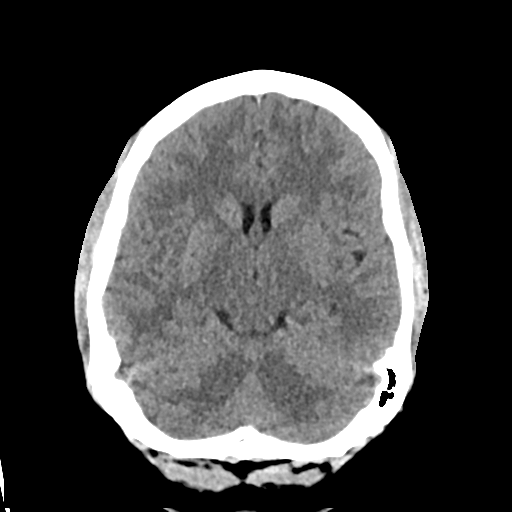
[im 16/32  brain]
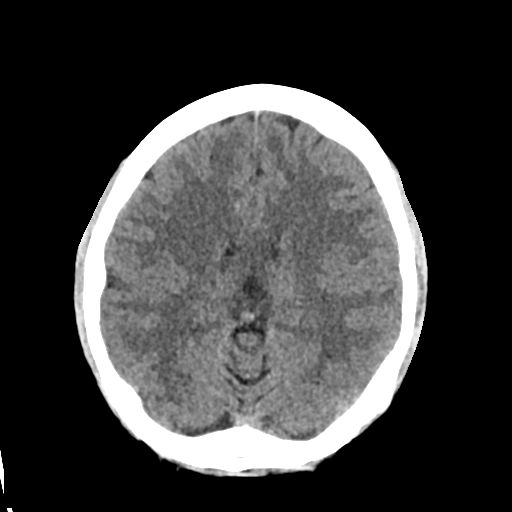
[im 20/32  brain]
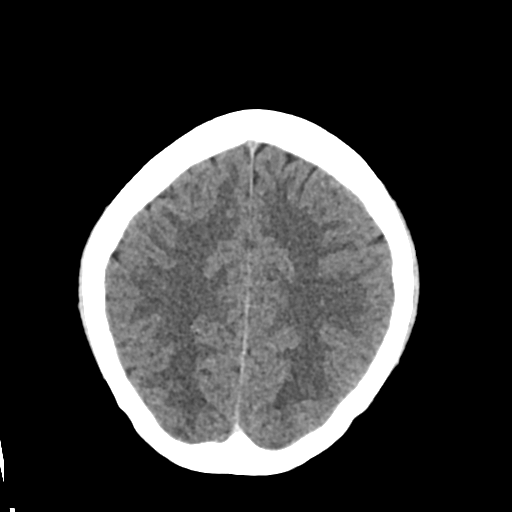
[im 20/32  bone]
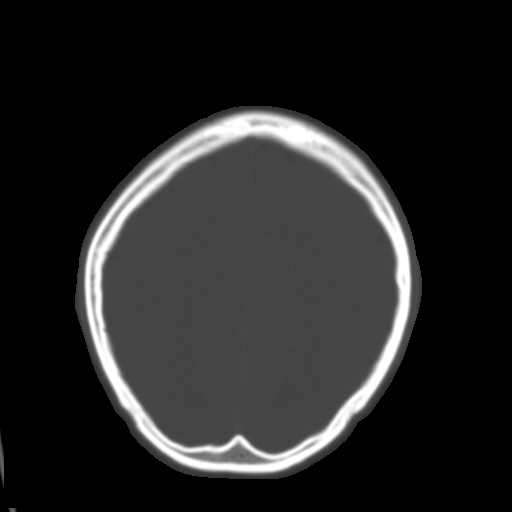
[im 24/32  brain]
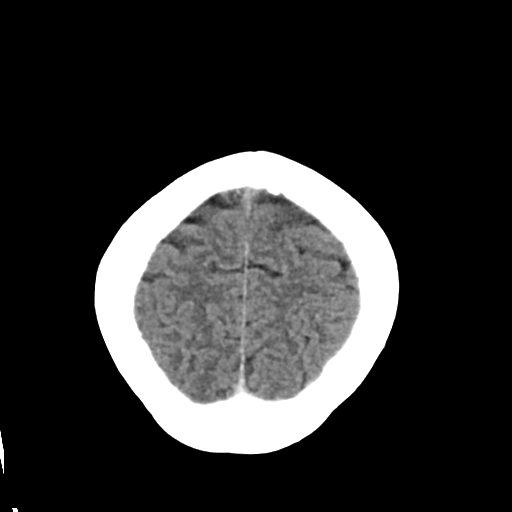
[im 28/32  brain]
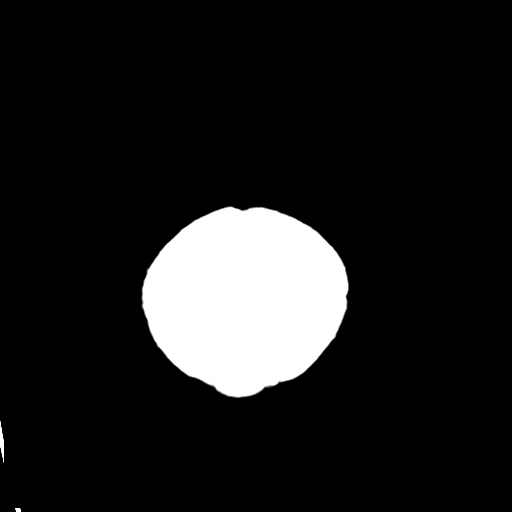

[Series 4: head bone · axial · 0.40mm/px · z∈[-94,-38]mm · 4 of 79 slices shown]
[im 8/79  bone]
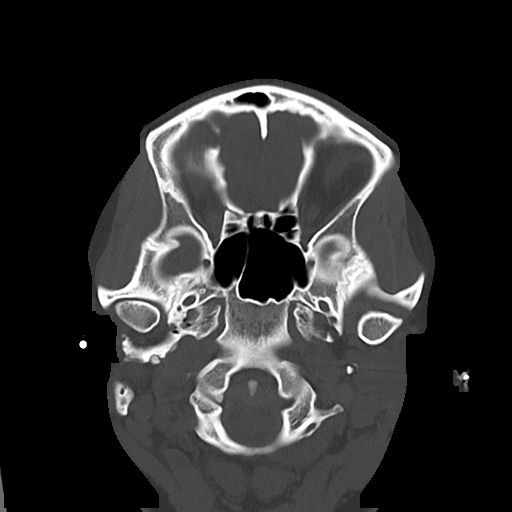
[im 16/79  bone]
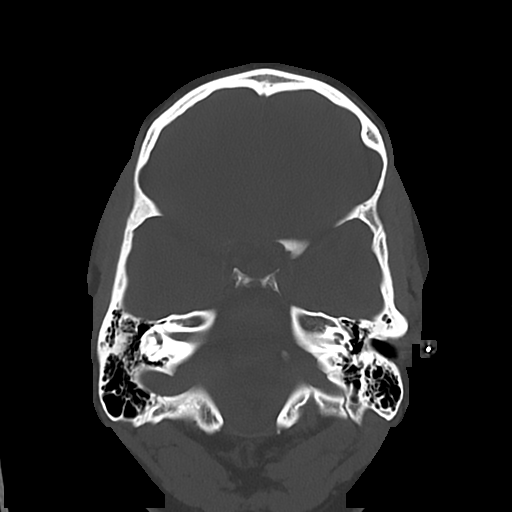
[im 24/79  bone]
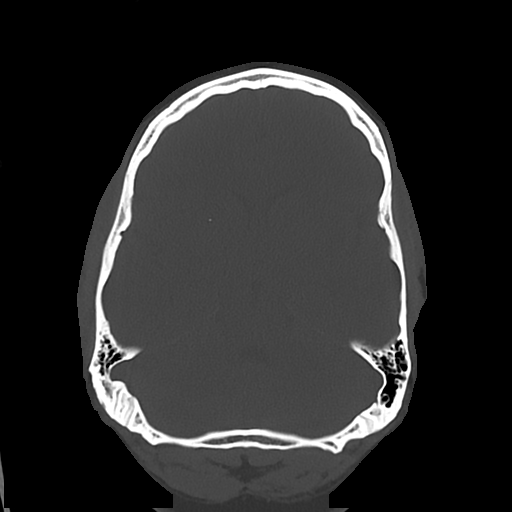
[im 36/79  bone]
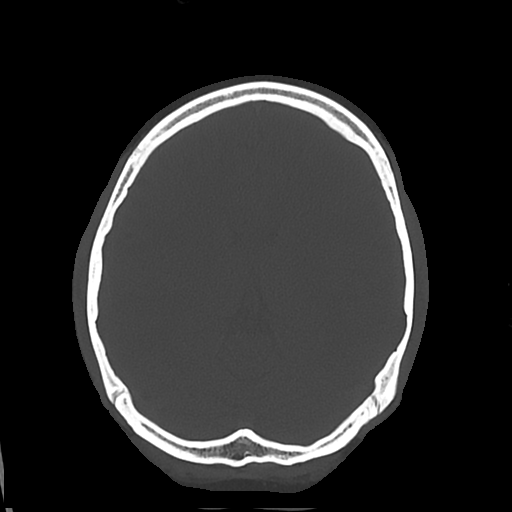

[Series 5: cor soft · coronal · 0.37mm/px · 3 of 68 slices shown]
[im 25/68  brain]
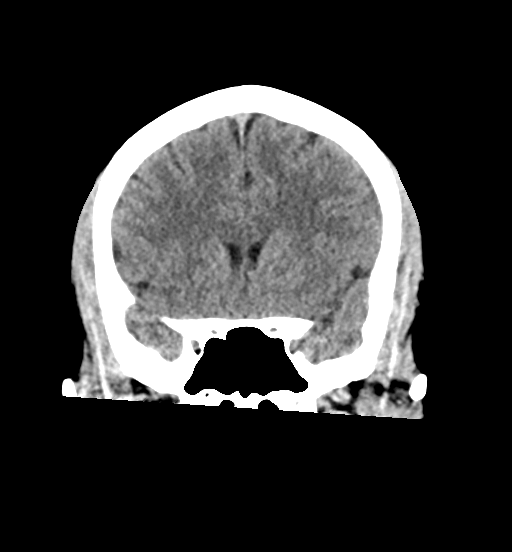
[im 31/68  brain]
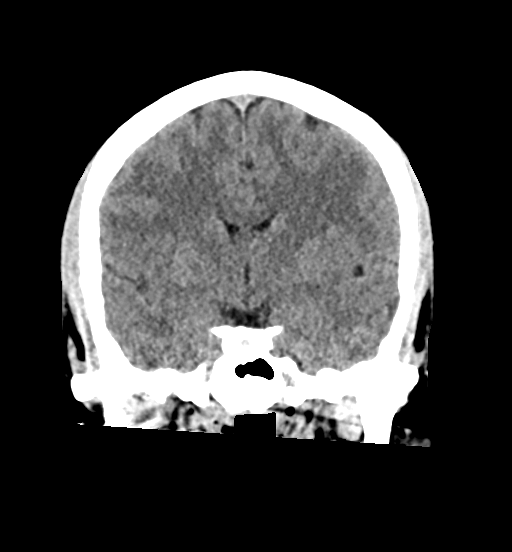
[im 37/68  brain]
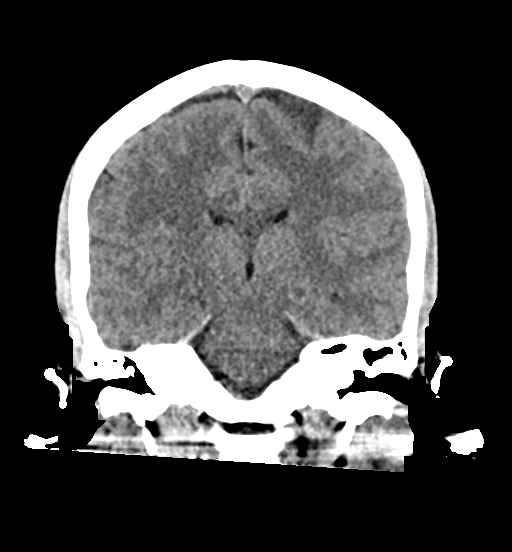

[Series 6: sag soft · sagittal · 0.38mm/px · 3 of 58 slices shown]
[im 20/58  brain]
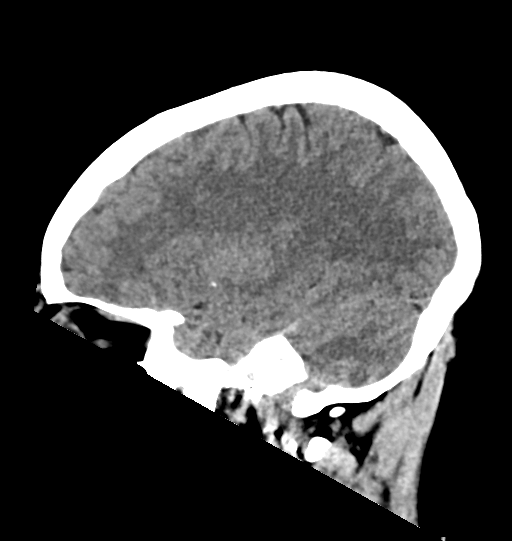
[im 29/58  brain]
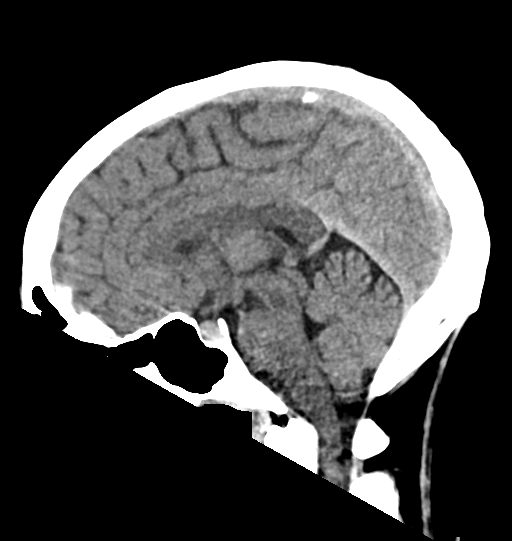
[im 39/58  brain]
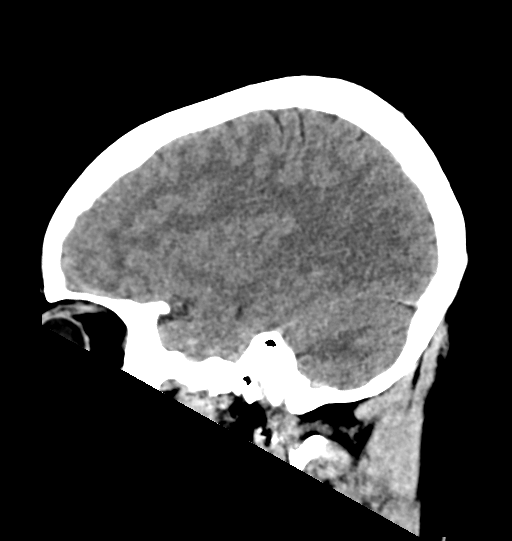

[17 of 47 positions shown; findings below may reference images not displayed]

FINDINGS: Brain: No evidence of acute infarction, hemorrhage, hydrocephalus,
extra-axial collection or mass lesion/mass effect. Right basal
ganglia calcification.

Vascular: No hyperdense vessel or unexpected calcification.

Skull: Normal. Negative for fracture or focal lesion.

Sinuses/Orbits: The visualized paranasal sinuses are essentially
clear. The mastoid air cells are unopacified.

Other: None.
IMPRESSION: Normal head CT.

## 2023-06-07 ENCOUNTER — Emergency Department (HOSPITAL_BASED_OUTPATIENT_CLINIC_OR_DEPARTMENT_OTHER)
Admission: EM | Admit: 2023-06-07 | Discharge: 2023-06-07 | Disposition: A | Discharge status: Home / Self Care | Payer: Self-pay | Attending: Emergency Medicine | Admitting: Emergency Medicine

## 2023-06-07 ENCOUNTER — Encounter (HOSPITAL_BASED_OUTPATIENT_CLINIC_OR_DEPARTMENT_OTHER): Payer: Self-pay | Admitting: Emergency Medicine

## 2023-06-07 ENCOUNTER — Other Ambulatory Visit: Payer: Self-pay

## 2023-06-07 DIAGNOSIS — L03012 Cellulitis of left finger: Secondary | ICD-10-CM | POA: Insufficient documentation

## 2023-06-07 MED ORDER — CEPHALEXIN 250 MG PO CAPS
500.0000 mg | ORAL_CAPSULE | Freq: Once | ORAL | Status: AC
  Administered 2023-06-07: 500 mg via ORAL
  Filled 2023-06-07: qty 2

## 2023-06-07 MED ORDER — CEPHALEXIN 250 MG PO CAPS: 250.0000 mg | ORAL_CAPSULE | Freq: Once | ORAL | Status: DC

## 2023-06-07 MED ORDER — CEPHALEXIN 500 MG PO CAPS: 500.0000 mg | ORAL_CAPSULE | Freq: Four times a day (QID) | ORAL | 0 refills | Status: AC

## 2023-06-07 MED ORDER — LIDOCAINE HCL (PF) 1 % IJ SOLN
30.0000 mL | Freq: Once | INTRAMUSCULAR | Status: AC
  Administered 2023-06-07: 30 mL
  Filled 2023-06-07: qty 30

## 2023-06-07 NOTE — ED Provider Notes (Signed)
[facility] AT [facility] Provider Note   CSN: [id number] Arrival date & time: 06/07/23  1742     History  Chief Complaint  Patient presents with   Finger Injury    Alejandra Bush is a 27 y.o. female.  With a history of seizure disorder and migraine headaches who presents to the ED for a finger infection.  Patient recently had acrylic nails placed over her nails at a local salon 1 week ago.  Over the last few days she has had significant amount of discomfort and swelling in her left index finger and return to the salon to get the artificial nail removed.  She noticed draining pus from a crack in her nailbed and decided to come to the ED for further evaluation given concern for infection.  No fevers chills or other complaints at this time.  HPI     Home Medications Prior to Admission medications   Medication Sig Start Date End Date Taking? Authorizing Provider  cephALEXin (KEFLEX) 500 MG capsule Take 1 capsule (500 mg total) by mouth 4 (four) times daily. 06/07/23  Yes Royanne Foots, DO  desogestrel-ethinyl estradiol (APRI) 0.15-30 MG-MCG tablet Take 1 tablet by mouth daily. Patient not taking: Reported on 02/21/2022 08/11/21 08/11/22  Hermina Staggers, MD  DULoxetine (CYMBALTA) 30 MG capsule Take 1 capsule (30 mg total) by mouth daily. 10/14/21   Ocie Doyne, MD  levETIRAcetam (KEPPRA) 250 MG tablet Take 1 tablet (250 mg total) by mouth 2 (two) times daily. 11/15/21     methocarbamol (ROBAXIN) 500 MG tablet Take 1 tablet (500 mg total) by mouth 2 (two) times daily as needed for muscle spasms. 10/14/21   Ocie Doyne, MD  metroNIDAZOLE (FLAGYL) 500 MG tablet Take 1 tablet (500 mg total) by mouth 2 (two) times daily. 02/24/22   Constant, Peggy, MD  tranexamic acid (LYSTEDA) 650 MG TABS tablet Take 2 tablets (1,300 mg total) by mouth 3 (three) times daily. Take during menses for a maximum of five days 08/11/21   Hermina Staggers, MD  zolmitriptan (ZOMIG)  5 MG tablet Take 1 tablet (5 mg total) by mouth daily as needed for migraine.  May repeat once after 2 hours if needed. Max of 2 tablets in 24 hours. Patient not taking: Reported on 02/21/2022 10/14/21   Ocie Doyne, MD  zonisamide (ZONEGRAN) 100 MG capsule Take 1 capsule (100 mg total) by mouth daily. 10/26/21   Levert Feinstein, MD      Allergies    Patient has no known allergies.    Review of Systems   Review of Systems  Physical Exam Updated Vital Signs BP (!) 148/85 (BP Location: Left Arm)   Pulse 81   Temp 97.7 F (36.5 C) (Oral)   Resp 18   Ht 5\' 7"  (1.702 m)   Wt 101.2 kg   SpO2 100%   BMI 34.93 kg/m  Physical Exam Musculoskeletal:     Comments: Purulent drainage from left index finger from crack in nailbed Significant tenderness over ulnar aspect of distal tip of left index finger with mild fluctuance and edema Sensation tact light touch Full active range of motion with flexion extension of left index finger     ED Results / Procedures / Treatments   Labs (all labs ordered are listed, but only abnormal results are displayed) Labs Reviewed - No data to display  EKG None  Radiology No results found.  Procedures .Incision and Drainage  Date/Time: 06/07/2023 9:37 PM  Performed by: Royanne Foots, DO Authorized by: Royanne Foots, DO   Consent:    Consent obtained:  Verbal   Consent given by:  Patient   Risks, benefits, and alternatives were discussed: yes     Risks discussed:  Bleeding, incomplete drainage and infection   Alternatives discussed:  No treatment Universal protocol:    Procedure explained and questions answered to patient or proxy's satisfaction: yes     Relevant documents present and verified: yes     Patient identity confirmed:  Verbally with patient Location:    Type:  Abscess   Size:  1   Location:  Upper extremity   Upper extremity location:  Finger   Finger location:  L index finger Pre-procedure details:    Skin preparation:   Antiseptic wash Sedation:    Sedation type:  None Anesthesia:    Anesthesia method:  Local infiltration   Local anesthetic:  Lidocaine 1% w/o epi Procedure type:    Complexity:  Simple Procedure details:    Incision types:  Single straight   Incision depth:  Subcutaneous   Wound management:  Probed and deloculated, irrigated with saline and extensive cleaning   Drainage:  Bloody and purulent   Drainage amount:  Scant   Wound treatment:  Wound left open   Packing materials:  None Post-procedure details:    Procedure completion:  Tolerated     Medications Ordered in ED Medications  cephALEXin (KEFLEX) capsule 500 mg (has no administration in time range)  lidocaine (PF) (XYLOCAINE) 1 % injection 30 mL (30 mLs Infiltration Given 06/07/23 2016)    ED Course/ Medical Decision Making/ A&P Clinical Course as of 06/07/23 2144  Wed Jun 07, 2023  2136 Patient tolerated incision and drainage of felon well.  Some purulent drainage expressed from incision and also from better fingernail again.  She likely expressed most of it by bending her fingernail back prior to arrival.  Will provide her with her first dose of Keflex here and prescribe 70 course for her to pick up from her pharmacy.  Will also provide the number for hand follow-up.  Return precautions are be worrisome for complicated or worsening infection were discussed in detail [MP]    Clinical Course User Index [MP] Royanne Foots, DO                                 Medical Decision Making 27 year old female presenting for suspected left index finger infection.  Exam consistent with felon of the left index finger.  Notable for tenderness swelling and purulent drainage on exam.  Will perform incision and drainage of left index finger felon after placing digital block and prescribed antibiotics  Risk Prescription drug management.           Final Clinical Impression(s) / ED Diagnoses Final diagnoses:  Abscess around  nail of left index finger    Rx / DC Orders ED Discharge Orders          Ordered    cephALEXin (KEFLEX) 500 MG capsule  4 times daily        06/07/23 2141              Royanne Foots, DO 06/07/23 2144

## 2023-06-07 NOTE — ED Triage Notes (Signed)
Reports left index finger pain after getting her nails done a week ago. States going to nail salon today to get acrylic nail removed and states the finger has been draining since. Denies issues with other nails.

## 2023-06-07 NOTE — Discharge Instructions (Signed)
You were seen in the emergency room and for an infection of your fingertip We were able to drain some pus out after making incision today We gave you your first dose of antibiotics here to treat the infection You will need to pick up the rest of the antibiotic from your pharmacy and begin taking as directed for the next 7 days You should follow-up with for a wound recheck within the next 48 hours You can either be seen by your primary care doctor go back to the emergency department or follow-up with a hand surgeon Please call the number listed for Dr. Janee Morn to follow-up with a hand surgeon in the office Return to the emergency department for severe pain in your finger, if you are unable to move your finger, have more draining pus or develop fevers

## 2023-06-07 NOTE — ED Notes (Signed)
 RN reviewed discharge instructions with pt. Pt verbalized understanding and had no further questions. VSS upon discharge.  

## 2023-08-01 ENCOUNTER — Telehealth: Payer: Self-pay

## 2023-08-01 NOTE — Transitions of Care (Post Inpatient/ED Visit) (Signed)
   08/01/2023  Name: Alejandra Bush MRN: [id number] DOB: [birth year]  Today's TOC FU Call Status:   Patient's Name and Date of Birth confirmed.  Transition Care Management Follow-up Telephone Call Date of Discharge: 07/30/23 Discharge Facility: Other ([facility]) Name of [facility] ([facility]charge Facility: [facility] Type of Discharge: Emergency Department Reason for ED Visit: Other: How have you been since you were released from the hospital?: Same Any questions or concerns?: No  Items Reviewed: Did you receive and understand the discharge instructions provided?: Yes Medications obtained,verified, and reconciled?: Yes (Medications Reviewed) Any new allergies since your discharge?: No Dietary orders reviewed?: No Do you have support at home?: Yes People in Home: sibling(s), significant other  Medications Reviewed Today: Medications Reviewed Today   Medications were not reviewed in this encounter     Home Care and Equipment/Supplies: Were Home Health Services Ordered?: No Any new equipment or medical supplies ordered?: No  Functional Questionnaire: Do you need assistance with bathing/showering or dressing?: No Do you need assistance with meal preparation?: No Do you need assistance with eating?: No Do you have difficulty maintaining continence: No Do you need assistance with getting out of bed/getting out of a chair/moving?: No Do you have difficulty managing or taking your medications?: No  Follow up appointments reviewed: PCP Follow-up appointment confirmed?: Yes Date of PCP follow-up appointment?: 08/28/23 Specialist Hospital Follow-up appointment confirmed?: No Do you need transportation to your follow-up appointment?: No Do you understand care options if your condition(s) worsen?: Yes-patient verbalized understanding    SIGNATURE Mattelyn Imhoff

## 2023-09-19 ENCOUNTER — Telehealth: Payer: Self-pay

## 2023-09-19 NOTE — Transitions of Care (Post Inpatient/ED Visit) (Signed)
 09/19/2023  Name: Alejandra Bush MRN: [id number] DOB: February 25, 1996  Today's TOC FU Call Status:   Patient's Name and Date of Birth confirmed.  Transition Care Management Follow-up Telephone Call Date of Discharge: 09/18/23 Discharge Facility: Other ([facility]) Name of Other [facility]charge Facility: [facility] Type of Discharge: Emergency Department Reason for ED Visit: Other: How have you been since you were released from the hospital?: Same Any questions or concerns?: No  Items Reviewed: Did you receive and understand the discharge instructions provided?: No Medications obtained,verified, and reconciled?: Yes (Medications Reviewed) Any new allergies since your discharge?: No Dietary orders reviewed?: No Do you have support at home?: Yes People in Home: spouse, sibling(s)  Medications Reviewed Today: Medications Reviewed Today     Reviewed by Veneta Penton, CMA (Certified Medical Assistant) on 09/19/23 at 1431  Med List Status: <None>   Medication Order Taking? Sig Documenting Provider Last Dose Status Informant  cephALEXin (KEFLEX) 500 MG capsule [id number] No Take 1 capsule (500 mg total) by mouth 4 (four) times daily.  Patient not taking: Reported on 09/19/2023   Royanne Foots, DO Not Taking Active   desogestrel-ethinyl estradiol (APRI) 0.15-30 MG-MCG tablet [id number]  Take 1 tablet by mouth daily.  Patient not taking: Reported on 02/21/2022   Hermina Staggers, MD  Expired 08/11/22 2359   DULoxetine (CYMBALTA) 30 MG capsule [id number] No Take 1 capsule (30 mg total) by mouth daily.  Patient not taking: Reported on 09/19/2023   Ocie Doyne, MD Not Taking Active   levETIRAcetam (KEPPRA) 250 MG tablet [id number] No Take 1 tablet (250 mg total) by mouth 2 (two) times daily.  Patient not taking: Reported on 09/19/2023    Not Taking Active   methocarbamol (ROBAXIN) 500 MG tablet [id number] No Take 1 tablet (500 mg total) by mouth 2 (two) times daily  as needed for muscle spasms.  Patient not taking: Reported on 09/19/2023   Ocie Doyne, MD Not Taking Active   metroNIDAZOLE (FLAGYL) 500 MG tablet [id number] No Take 1 tablet (500 mg total) by mouth 2 (two) times daily.  Patient not taking: Reported on 09/19/2023   Constant, Peggy, MD Not Taking Active   tranexamic acid (LYSTEDA) 650 MG TABS tablet [id number] No Take 2 tablets (1,300 mg total) by mouth 3 (three) times daily. Take during menses for a maximum of five days  Patient not taking: Reported on 09/19/2023   Hermina Staggers, MD Not Taking Active   zolmitriptan (ZOMIG) 5 MG tablet [id number] No Take 1 tablet (5 mg total) by mouth daily as needed for migraine.  May repeat once after 2 hours if needed. Max of 2 tablets in 24 hours.  Patient not taking: Reported on 09/19/2023   Ocie Doyne, MD Not Taking Active   zonisamide (ZONEGRAN) 100 MG capsule [id number] No Take 1 capsule (100 mg total) by mouth daily.  Patient not taking: Reported on 09/19/2023   Levert Feinstein, MD Not Taking Active             Home Care and Equipment/Supplies: Were Home Health Services Ordered?: No Any new equipment or medical supplies ordered?: No  Functional Questionnaire: Do you need assistance with bathing/showering or dressing?: No Do you need assistance with meal preparation?: No Do you need assistance with eating?: No Do you have difficulty maintaining continence: No Do you need assistance with getting out of bed/getting out of a chair/moving?: No Do you have difficulty managing or taking your medications?: No  Follow up appointments  reviewed: PCP Follow-up appointment confirmed?: Yes Date of PCP follow-up appointment?: 09/25/23 Specialist Hospital Follow-up appointment confirmed?: No Do you need transportation to your follow-up appointment?: No Do you understand care options if your condition(s) worsen?: Yes-patient verbalized understanding    SIGNATURE Eward Rutigliano, RMA

## 2023-11-21 ENCOUNTER — Telehealth: Payer: Self-pay

## 2023-11-21 NOTE — Transitions of Care (Post Inpatient/ED Visit) (Signed)
 11/21/2023  Name: Alejandra Bush MRN: [id number] DOB: 08-21-1995  Today's TOC FU Call Status:   Patient's Name and Date of Birth confirmed.  Transition Care Management Follow-up Telephone Call Date of Discharge: 11/15/23 Discharge Facility: Other ([facility]) Name of Other [facility]charge Facility: [facility] Type of Discharge: Emergency Department Reason for ED Visit: Neurologic How have you been since you were released from the hospital?: Better Any questions or concerns?: No  Items Reviewed: Did you receive and understand the discharge instructions provided?: Yes Medications obtained,verified, and reconciled?: Yes (Medications Reviewed) Any new allergies since your discharge?: No Dietary orders reviewed?: No Do you have support at home?: Yes People in Home [RPT]: significant other  Medications Reviewed Today: Medications Reviewed Today     Reviewed by Angelita Bares, CMA (Certified Medical Assistant) on 11/21/23 at [phone]  Med List Status: <None>   Medication Order Taking? Sig Documenting Provider Last Dose Status Informant  cephALEXin  (KEFLEX ) 500 MG capsule [id number] No Take 1 capsule (500 mg total) by mouth 4 (four) times daily.  Patient not taking: Reported on 09/19/2023   Sallyanne Creamer, DO Not Taking Active   desogestrel -ethinyl estradiol  (APRI ) 0.15-30 MG-MCG tablet [id number] No Take 1 tablet by mouth daily.  Patient not taking: Reported on 02/21/2022   Ervin, Michael L, MD Not Taking Expired 08/11/22 2359   DULoxetine  (CYMBALTA ) 30 MG capsule [id number] No Take 1 capsule (30 mg total) by mouth daily.  Patient not taking: Reported on 09/19/2023   Dala Dublin, MD Not Taking Active   levETIRAcetam  (KEPPRA ) 250 MG tablet [id number] No Take 1 tablet (250 mg total) by mouth 2 (two) times daily.  Patient not taking: Reported on 09/19/2023    Not Taking Active   methocarbamol  (ROBAXIN ) 500 MG tablet [id number] No Take 1 tablet (500 mg total) by mouth 2  (two) times daily as needed for muscle spasms.  Patient not taking: Reported on 09/19/2023   Dala Dublin, MD Not Taking Active   metroNIDAZOLE  (FLAGYL ) 500 MG tablet [id number] No Take 1 tablet (500 mg total) by mouth 2 (two) times daily.  Patient not taking: Reported on 09/19/2023   Constant, Peggy, MD Not Taking Active   tranexamic acid  (LYSTEDA ) 650 MG TABS tablet [id number] No Take 2 tablets (1,300 mg total) by mouth 3 (three) times daily. Take during menses for a maximum of five days  Patient not taking: Reported on 09/19/2023   Ervin, Michael L, MD Not Taking Active   zolmitriptan  (ZOMIG ) 5 MG tablet [id number] No Take 1 tablet (5 mg total) by mouth daily as needed for migraine.  May repeat once after 2 hours if needed. Max of 2 tablets in 24 hours.  Patient not taking: Reported on 09/19/2023   Dala Dublin, MD Not Taking Active   zonisamide  (ZONEGRAN ) 100 MG capsule [id number] No Take 1 capsule (100 mg total) by mouth daily.  Patient not taking: Reported on 09/19/2023   Phebe Brasil, MD Not Taking Active             Home Care and Equipment/Supplies: Were Home Health Services Ordered?: No Any new equipment or medical supplies ordered?: No  Functional Questionnaire: Do you need assistance with bathing/showering or dressing?: No Do you need assistance with meal preparation?: No Do you need assistance with eating?: No Do you have difficulty maintaining continence: No Do you need assistance with getting out of bed/getting out of a chair/moving?: No Do you have difficulty managing or taking your medications?: No  Follow up  appointments reviewed: Specialist Hospital Follow-up appointment confirmed?: NA Do you need transportation to your follow-up appointment?: No Do you understand care options if your condition(s) worsen?: Yes-patient verbalized understanding    SIGNATURE Dewey Neukam, RMA

## 2023-11-28 ENCOUNTER — Inpatient Hospital Stay: Payer: Self-pay | Admitting: Nurse Practitioner

## 2023-12-21 ENCOUNTER — Telehealth: Payer: Self-pay

## 2023-12-21 NOTE — Transitions of Care (Post Inpatient/ED Visit) (Signed)
 12/21/2023  Name: Alejandra Bush MRN: [id number] DOB: 14-Feb-1996  Today's TOC FU Call Status:   Patient's Name and Date of Birth confirmed.  Transition Care Management Follow-up Telephone Call Date of Discharge: 12/21/23 Discharge Facility: [facility] ([facility]) Type of Discharge: Emergency Department Reason for ED Visit: Other: How have you been since you were released from the hospital?: Better Any questions or concerns?: No  Items Reviewed: Did you receive and understand the discharge instructions provided?: Yes Medications obtained,verified, and reconciled?: Yes (Medications Reviewed) Any new allergies since your discharge?: No Dietary orders reviewed?: No Do you have support at home?: Yes People in Home [RPT]: significant other  Medications Reviewed Today: Medications Reviewed Today     Reviewed by Angelita Bares, CMA (Certified Medical Assistant) on 12/21/23 at 1158  Med List Status: <None>   Medication Order Taking? Sig Documenting Provider Last Dose Status Informant  cephALEXin  (KEFLEX ) 500 MG capsule [id number] No Take 1 capsule (500 mg total) by mouth 4 (four) times daily.  Patient not taking: Reported on 09/19/2023   Sallyanne Creamer, DO Not Taking Active   desogestrel -ethinyl estradiol  (APRI ) 0.15-30 MG-MCG tablet [id number] No Take 1 tablet by mouth daily.  Patient not taking: Reported on 02/21/2022   Ervin, Michael L, MD Not Taking Expired 08/11/22 2359   DULoxetine  (CYMBALTA ) 30 MG capsule [id number] No Take 1 capsule (30 mg total) by mouth daily.  Patient not taking: Reported on 09/19/2023   Dala Dublin, MD Not Taking Active   levETIRAcetam  (KEPPRA ) 250 MG tablet [id number] No Take 1 tablet (250 mg total) by mouth 2 (two) times daily.  Patient not taking: Reported on 09/19/2023    Not Taking Active   methocarbamol  (ROBAXIN ) 500 MG tablet [id number] No Take 1 tablet (500 mg total) by mouth 2 (two) times daily as needed for muscle spasms.  Patient  not taking: Reported on 09/19/2023   Dala Dublin, MD Not Taking Active   metroNIDAZOLE  (FLAGYL ) 500 MG tablet [id number] No Take 1 tablet (500 mg total) by mouth 2 (two) times daily.  Patient not taking: Reported on 09/19/2023   Constant, Peggy, MD Not Taking Active   tranexamic acid  (LYSTEDA ) 650 MG TABS tablet [id number] No Take 2 tablets (1,300 mg total) by mouth 3 (three) times daily. Take during menses for a maximum of five days  Patient not taking: Reported on 09/19/2023   Ervin, Michael L, MD Not Taking Active   zolmitriptan  (ZOMIG ) 5 MG tablet [id number] No Take 1 tablet (5 mg total) by mouth daily as needed for migraine.  May repeat once after 2 hours if needed. Max of 2 tablets in 24 hours.  Patient not taking: Reported on 09/19/2023   Dala Dublin, MD Not Taking Active   zonisamide  (ZONEGRAN ) 100 MG capsule [id number] No Take 1 capsule (100 mg total) by mouth daily.  Patient not taking: Reported on 09/19/2023   Phebe Brasil, MD Not Taking Active             Home Care and Equipment/Supplies: Were Home Health Services Ordered?: No Any new equipment or medical supplies ordered?: No  Functional Questionnaire: Do you need assistance with bathing/showering or dressing?: No Do you need assistance with meal preparation?: No Do you need assistance with eating?: No Do you have difficulty maintaining continence: No Do you need assistance with getting out of bed/getting out of a chair/moving?: No Do you have difficulty managing or taking your medications?: No  Follow up appointments reviewed: Specialist Hospital Follow-up appointment confirmed?:  NA Do you need transportation to your follow-up appointment?: No Do you understand care options if your condition(s) worsen?: Yes-patient verbalized understanding    SIGNATURE Tyrease Vandeberg, RMA

## 2024-02-27 ENCOUNTER — Telehealth: Payer: Self-pay

## 2024-02-27 NOTE — Transitions of Care (Post Inpatient/ED Visit) (Signed)
   02/27/2024  Name: Alejandra Bush MRN: [id number] DOB: [birth year]  Today's TOC FU Call Status: Today's TOC FU Call Status:: Unsuccessful Call (1st Attempt) Unsuccessful Call (1st Attempt) Date: 02/27/24  Attempted to reach the patient regarding the most recent Inpatient/ED visit.  Follow Up Plan: Additional outreach attempts will be made to reach the patient to complete the Transitions of Care (Post Inpatient/ED visit) call.   Signature  [employer], [place]

## 2024-03-05 ENCOUNTER — Telehealth: Payer: Self-pay

## 2024-03-05 NOTE — Transitions of Care (Post Inpatient/ED Visit) (Signed)
   03/05/2024  Name: Alejandra Bush MRN: [id number] DOB: [birth year]  Today's TOC FU Call Status:    Attempted to reach the patient regarding the most recent Inpatient/ED visit.  Follow Up Plan: No further outreach attempts will be made at this time. We have been unable to contact the patient.  Signature  Haskel Dewalt Ubly, RMA No longer our patient
